# Patient Record
Sex: Female | Born: 1960 | Race: White | Hispanic: No | Marital: Single
Health system: Midwestern US, Community
[De-identification: ages and names within clinical notes are randomized; demographics above are authoritative.]

## PROBLEM LIST (undated history)

## (undated) DIAGNOSIS — E785 Hyperlipidemia, unspecified: Secondary | ICD-10-CM

## (undated) DIAGNOSIS — F419 Anxiety disorder, unspecified: Secondary | ICD-10-CM

## (undated) DIAGNOSIS — N809 Endometriosis, unspecified: Secondary | ICD-10-CM

## (undated) DIAGNOSIS — J449 Chronic obstructive pulmonary disease, unspecified: Secondary | ICD-10-CM

## (undated) DIAGNOSIS — I10 Essential (primary) hypertension: Secondary | ICD-10-CM

## (undated) DIAGNOSIS — E559 Vitamin D deficiency, unspecified: Secondary | ICD-10-CM

## (undated) DIAGNOSIS — M7580 Other shoulder lesions, unspecified shoulder: Secondary | ICD-10-CM

## (undated) DIAGNOSIS — M5136 Other intervertebral disc degeneration, lumbar region: Secondary | ICD-10-CM

## (undated) DIAGNOSIS — M8580 Other specified disorders of bone density and structure, unspecified site: Secondary | ICD-10-CM

## (undated) DIAGNOSIS — M51369 Other intervertebral disc degeneration, lumbar region without mention of lumbar back pain or lower extremity pain: Secondary | ICD-10-CM

## (undated) DIAGNOSIS — M545 Low back pain, unspecified: Secondary | ICD-10-CM

## (undated) DIAGNOSIS — M419 Scoliosis, unspecified: Secondary | ICD-10-CM

## (undated) DIAGNOSIS — Z803 Family history of malignant neoplasm of breast: Principal | ICD-10-CM

## (undated) DIAGNOSIS — Z1231 Encounter for screening mammogram for malignant neoplasm of breast: Secondary | ICD-10-CM

## (undated) DIAGNOSIS — M5412 Radiculopathy, cervical region: Secondary | ICD-10-CM

## (undated) HISTORY — PX: BREAST SURGERY: SHX581

## (undated) HISTORY — PX: ABDOMINAL HYSTERECTOMY: SHX81

## (undated) HISTORY — DX: Other specified disorders of bone density and structure, unspecified site: M85.80

## (undated) HISTORY — PX: LEEP: SHX91

## (undated) HISTORY — DX: Endometriosis, unspecified: N80.9

## (undated) HISTORY — DX: Vitamin D deficiency, unspecified: E55.9

## (undated) HISTORY — DX: Chronic obstructive pulmonary disease, unspecified: J44.9

## (undated) HISTORY — DX: Anxiety disorder, unspecified: F41.9

## (undated) HISTORY — PX: OTHER SURGICAL HISTORY: SHX169

---

## 1998-01-16 ENCOUNTER — Inpatient Hospital Stay (HOSPITAL_COMMUNITY): Admission: RE | Admit: 1998-01-16 | Discharge: 1998-01-17 | Payer: Self-pay | Admitting: *Deleted

## 1998-11-24 ENCOUNTER — Other Ambulatory Visit: Admission: RE | Admit: 1998-11-24 | Discharge: 1998-11-24 | Payer: Self-pay | Admitting: *Deleted

## 1999-10-25 ENCOUNTER — Encounter: Admission: RE | Admit: 1999-10-25 | Discharge: 1999-10-25 | Payer: Self-pay | Admitting: Neurology

## 1999-10-25 ENCOUNTER — Encounter: Payer: Self-pay | Admitting: Neurology

## 2000-05-09 ENCOUNTER — Other Ambulatory Visit: Admission: RE | Admit: 2000-05-09 | Discharge: 2000-05-09 | Payer: Self-pay | Admitting: *Deleted

## 2001-05-18 ENCOUNTER — Other Ambulatory Visit: Admission: RE | Admit: 2001-05-18 | Discharge: 2001-05-18 | Payer: Self-pay | Admitting: Obstetrics and Gynecology

## 2002-05-17 ENCOUNTER — Other Ambulatory Visit: Admission: RE | Admit: 2002-05-17 | Discharge: 2002-05-17 | Payer: Self-pay | Admitting: Obstetrics and Gynecology

## 2002-05-21 ENCOUNTER — Encounter: Admission: RE | Admit: 2002-05-21 | Discharge: 2002-05-21 | Payer: Self-pay | Admitting: *Deleted

## 2002-05-21 ENCOUNTER — Encounter: Payer: Self-pay | Admitting: *Deleted

## 2002-07-02 ENCOUNTER — Ambulatory Visit (HOSPITAL_BASED_OUTPATIENT_CLINIC_OR_DEPARTMENT_OTHER): Admission: RE | Admit: 2002-07-02 | Discharge: 2002-07-02 | Payer: Self-pay | Admitting: *Deleted

## 2003-06-14 ENCOUNTER — Other Ambulatory Visit: Admission: RE | Admit: 2003-06-14 | Discharge: 2003-06-14 | Payer: Self-pay | Admitting: *Deleted

## 2004-02-29 ENCOUNTER — Ambulatory Visit: Payer: Self-pay | Admitting: Family Medicine

## 2004-06-20 ENCOUNTER — Ambulatory Visit: Payer: Self-pay | Admitting: Family Medicine

## 2004-06-25 ENCOUNTER — Ambulatory Visit: Payer: Self-pay | Admitting: Family Medicine

## 2004-09-17 ENCOUNTER — Other Ambulatory Visit: Admission: RE | Admit: 2004-09-17 | Discharge: 2004-09-17 | Payer: Self-pay | Admitting: Obstetrics and Gynecology

## 2005-08-29 ENCOUNTER — Inpatient Hospital Stay (HOSPITAL_COMMUNITY): Admission: EM | Admit: 2005-08-29 | Discharge: 2005-09-01 | Payer: Self-pay | Admitting: Emergency Medicine

## 2005-08-30 ENCOUNTER — Ambulatory Visit: Payer: Self-pay | Admitting: Cardiology

## 2005-08-30 ENCOUNTER — Encounter: Payer: Self-pay | Admitting: Cardiology

## 2007-04-16 HISTORY — PX: BREAST EXCISIONAL BIOPSY: SUR124

## 2007-04-16 HISTORY — PX: BREAST BIOPSY: SHX20

## 2007-04-16 HISTORY — PX: OTHER SURGICAL HISTORY: SHX169

## 2007-07-02 ENCOUNTER — Encounter: Admission: RE | Admit: 2007-07-02 | Discharge: 2007-07-02 | Payer: Self-pay | Admitting: Sports Medicine

## 2007-07-09 ENCOUNTER — Ambulatory Visit (HOSPITAL_BASED_OUTPATIENT_CLINIC_OR_DEPARTMENT_OTHER): Admission: RE | Admit: 2007-07-09 | Discharge: 2007-07-09 | Payer: Self-pay | Admitting: Orthopedic Surgery

## 2007-11-27 ENCOUNTER — Encounter: Admission: RE | Admit: 2007-11-27 | Discharge: 2007-11-27 | Payer: Self-pay | Admitting: Obstetrics and Gynecology

## 2007-12-03 ENCOUNTER — Encounter: Admission: RE | Admit: 2007-12-03 | Discharge: 2007-12-03 | Payer: Self-pay | Admitting: Obstetrics and Gynecology

## 2008-01-05 ENCOUNTER — Encounter: Admission: RE | Admit: 2008-01-05 | Discharge: 2008-01-05 | Payer: Self-pay | Admitting: General Surgery

## 2008-01-08 ENCOUNTER — Other Ambulatory Visit: Admission: RE | Admit: 2008-01-08 | Discharge: 2008-01-08 | Payer: Self-pay | Admitting: General Surgery

## 2008-01-08 ENCOUNTER — Encounter: Admission: RE | Admit: 2008-01-08 | Discharge: 2008-01-08 | Payer: Self-pay | Admitting: General Surgery

## 2009-02-03 ENCOUNTER — Observation Stay (HOSPITAL_COMMUNITY): Admission: EM | Admit: 2009-02-03 | Discharge: 2009-02-03 | Payer: Self-pay | Admitting: Emergency Medicine

## 2009-09-29 ENCOUNTER — Encounter: Payer: Self-pay | Admitting: Gastroenterology

## 2009-10-02 ENCOUNTER — Encounter (INDEPENDENT_AMBULATORY_CARE_PROVIDER_SITE_OTHER): Payer: Self-pay | Admitting: *Deleted

## 2009-10-13 ENCOUNTER — Ambulatory Visit: Payer: Self-pay | Admitting: Gastroenterology

## 2009-10-13 ENCOUNTER — Ambulatory Visit: Payer: Self-pay | Admitting: Cardiology

## 2009-10-13 DIAGNOSIS — K5732 Diverticulitis of large intestine without perforation or abscess without bleeding: Secondary | ICD-10-CM

## 2009-10-13 DIAGNOSIS — M479 Spondylosis, unspecified: Secondary | ICD-10-CM | POA: Insufficient documentation

## 2009-10-13 DIAGNOSIS — Z90710 Acquired absence of both cervix and uterus: Secondary | ICD-10-CM

## 2009-10-13 DIAGNOSIS — R1084 Generalized abdominal pain: Secondary | ICD-10-CM | POA: Insufficient documentation

## 2009-10-17 ENCOUNTER — Encounter (INDEPENDENT_AMBULATORY_CARE_PROVIDER_SITE_OTHER): Payer: Self-pay | Admitting: *Deleted

## 2009-10-17 ENCOUNTER — Telehealth: Payer: Self-pay | Admitting: Gastroenterology

## 2009-10-19 ENCOUNTER — Ambulatory Visit: Payer: Self-pay | Admitting: Gastroenterology

## 2009-10-23 ENCOUNTER — Ambulatory Visit: Payer: Self-pay | Admitting: Gastroenterology

## 2009-10-26 ENCOUNTER — Encounter: Payer: Self-pay | Admitting: Gastroenterology

## 2009-10-26 DIAGNOSIS — E538 Deficiency of other specified B group vitamins: Secondary | ICD-10-CM

## 2009-10-26 LAB — CONVERTED CEMR LAB
AST: 17 units/L (ref 0–37)
Alkaline Phosphatase: 47 units/L (ref 39–117)
Basophils Absolute: 0 10*3/uL (ref 0.0–0.1)
CRP, High Sensitivity: 0.47 (ref 0.00–5.00)
GFR calc non Af Amer: 105.76 mL/min (ref >60)
HCT: 42.1 % (ref 36.0–46.0)
Iron: 101 ug/dL (ref 42–145)
Lymphs Abs: 3.1 10*3/uL (ref 0.7–4.0)
MCV: 99.1 fL (ref 78.0–100.0)
Monocytes Absolute: 0.5 10*3/uL (ref 0.1–1.0)
Platelets: 230 10*3/uL (ref 150.0–400.0)
Potassium: 4.9 meq/L (ref 3.5–5.1)
RDW: 13.8 % (ref 11.5–14.6)
Sed Rate: 5 mm/hr (ref 0–22)
Sodium: 141 meq/L (ref 135–145)
TSH: 1.27 microintl units/mL (ref 0.35–5.50)
Total Bilirubin: 0.4 mg/dL (ref 0.3–1.2)

## 2009-11-01 ENCOUNTER — Encounter: Admission: RE | Admit: 2009-11-01 | Discharge: 2009-11-01 | Payer: Self-pay | Admitting: Internal Medicine

## 2009-11-01 ENCOUNTER — Encounter: Payer: Self-pay | Admitting: Gastroenterology

## 2010-05-15 NOTE — Letter (Signed)
Summary: [facility]   Imported By: Lester Glenwood 10/19/2009 11:22:07  _____________________________________________________________________  External Attachment:    Type:   Image     Comment:   External Document

## 2010-05-15 NOTE — Progress Notes (Signed)
Summary: Change in Pain meds  Phone Note Call from Patient Call back at [phone]   Caller: Patient Summary of Call: pt states that she tried the tramadol and it made her vomit and have alot of nausea. she is wanting to know if she can have something eles in place, she even ate with the meds.  Initial call taken by: Harlow Mares CMA Duncan Dull),  October 17, 2009 8:41 AM  Follow-up for Phone Call        needs colonoscopy ASAP. She can use oxycontin 10 mg q6-8 h .#50.Marland Kitchenno refill for now... Follow-up by: Mardella Layman MD Clementeen Graham,  October 17, 2009 11:28 AM  Additional Follow-up for Phone Call Additional follow up Details #1::        LM for pt to call/   Ashok Cordia RN  October 17, 2009 2:54 PM  Talked with pt.  States she can not take Oyxcodone.  She has trougle taking strong medications.  Pt states she took Lorcet years ago when she broke her ankle and tolerated it OK.  Pt asks if she can try this until she has colon next week.  Will pick up Rx tomorrow when she comes for previsit. Additional Follow-up by: Ashok Cordia RN,  October 18, 2009 8:49 AM    Additional Follow-up for Phone Call Additional follow up Details #2::    ok Follow-up by: Mardella Layman MD [facility],  October 18, 2009 9:32 AM  Additional Follow-up for Phone Call Additional follow up Details #3:: Details for Additional Follow-up Action Taken: Rx printed.  for Dr. Jarold Motto to sign.  Pt will pick up tomorrow. Additional Follow-up by: Ashok Cordia RN,  October 18, 2009 9:41 AM  New/Updated Medications: LORCET PLUS 7.5-650 MG TABS (HYDROCODONE-ACETAMINOPHEN) 1 q 6 hrs as needed for pain Prescriptions: LORCET PLUS 7.5-650 MG TABS (HYDROCODONE-ACETAMINOPHEN) 1 q 6 hrs as needed for pain  #30 x 0   Entered by:   Ashok Cordia RN   Authorized by:   Mardella Layman MD [facility]   Signed by:   Ashok Cordia RN on 10/18/2009   Method used:   Print then Give to Patient   RxID:   [id number]

## 2010-05-15 NOTE — Letter (Signed)
Summary: New Patient letter  [facility]  [address] [place], [place] [address]   Phone: [phone]  Fax: [phone]       10/02/2009 MRN: [id number]  Monique Arroyo [address] [place], [place]  [address]  Dear Ms. Bratcher,  Welcome to the Gastroenterology Division at [facility].    You are scheduled to see Dr. Jarold Motto on 10/13/2009 at 10:00AM on the 3rd floor at [facility], [address]  We ask that you try to arrive at our office 15 minutes prior to your appointment time to allow for check-in.  We would like you to complete the enclosed self-administered evaluation form prior to your visit and bring it with you on the day of your appointment.  We will review it with you.  Also, please bring a complete list of all your medications or, if you prefer, bring the medication bottles and we will list them.  Please bring your insurance card so that we may make a copy of it.  If your insurance requires a referral to see a specialist, please bring your referral form from your primary care physician.  Co-payments are due at the time of your visit and may be paid by cash, check or credit card.     Your office visit will consist of a consult with your physician (includes a physical exam), any laboratory testing he/she may order, scheduling of any necessary diagnostic testing (e.g. x-ray, ultrasound, CT-scan), and scheduling of a procedure (e.g. Endoscopy, Colonoscopy) if required.  Please allow enough time on your schedule to allow for any/all of these possibilities.    If you cannot keep your appointment, please call [phone] to cancel or reschedule prior to your appointment date.  This allows [place] the opportunity to schedule an appointment for another patient in need of care.  If you do not cancel or reschedule by 5 p.m. the business day prior to your appointment date, you will be charged a $50.00 late cancellation/no-show fee.    Thank you for choosing [facility] for your medical needs.  We appreciate the opportunity to care for you.  Please visit [place] at our website  to learn more about our practice.                     Sincerely,                                                             The Gastroenterology Division

## 2010-05-15 NOTE — Letter (Signed)
Summary: [facility] Instructions  [facility]  [address] [place], [place] [address]   Phone: [phone]  Fax: [phone]       Monique Arroyo    23-May-1960    MRN: [id number]        Procedure Day [name]:  Monique Arroyo  10/23/09     Arrival Time:  2:00pm     Procedure Time:  3:00pm     Location of Procedure:                    Monique Arroyo  [facility] (4th Floor)                        PREPARATION FOR COLONOSCOPY WITH MOVIPREP   Starting 5 days prior to your procedure  [facility] 07/06  do not eat nuts, seeds, popcorn, corn, beans, peas,  salads, or any raw vegetables.  Do not take any fiber supplements (e.g. Metamucil, Citrucel, and Benefiber).  THE DAY BEFORE YOUR PROCEDURE         DATE:  SUNDAY  07/10  1.  Drink clear liquids the entire day-NO SOLID FOOD  2.  Do not drink anything colored red or purple.  Avoid juices with pulp.  No orange juice.  3.  Drink at least 64 oz. (8 glasses) of fluid/clear liquids during the day to prevent dehydration and help the prep work efficiently.  CLEAR LIQUIDS INCLUDE: Water Jello Ice Popsicles Tea (sugar ok, no milk/cream) Powdered fruit flavored drinks Coffee (sugar ok, no milk/cream) Gatorade Juice: apple, white grape, white cranberry  Lemonade Clear bullion, consomm, broth Carbonated beverages (any kind) Strained chicken noodle soup Hard Candy                             4.  In the morning, mix first dose of MoviPrep solution:    Empty 1 Pouch A and 1 Pouch B into the disposable container    Add lukewarm drinking water to the top line of the container. Mix to dissolve    Refrigerate (mixed solution should be used within 24 hrs)  5.  Begin drinking the prep at 5:00 p.m. The MoviPrep container is divided by 4 marks.   Every 15 minutes drink the solution down to the next mark (approximately 8 oz) until the full liter is complete.   6.  Follow completed prep with 16 oz of clear liquid of your choice  (Nothing red or purple).  Continue to drink clear liquids until bedtime.  7.  Before going to bed, mix second dose of MoviPrep solution:    Empty 1 Pouch A and 1 Pouch B into the disposable container    Add lukewarm drinking water to the top line of the container. Mix to dissolve    Refrigerate  THE DAY OF YOUR PROCEDURE      DATE:  MONDAY  07/11  Beginning at  10:00 a.m. (5 hours before procedure):         1. Every 15 minutes, drink the solution down to the next mark (approx 8 oz) until the full liter is complete.  2. Follow completed prep with 16 oz. of clear liquid of your choice.    3. You may drink clear liquids until 1:00pm  (2 HOURS BEFORE PROCEDURE).   MEDICATION INSTRUCTIONS  Unless otherwise instructed, you should take regular prescription medications with a small sip of water  as early as possible the morning of your procedure.        OTHER INSTRUCTIONS  You will need a responsible adult at least 52 years of age to accompany you and drive you home.   This person must remain in the waiting room during your procedure.  Wear loose fitting clothing that is easily removed.  Leave jewelry and other valuables at home.  However, you may wish to bring a book to read or  an iPod/MP3 player to listen to music as you wait for your procedure to start.  Remove all body piercing jewelry and leave at home.  Total time from sign-in until discharge is approximately 2-3 hours.  You should go home directly after your procedure and rest.  You can resume normal activities the  day after your procedure.  The day of your procedure you should not:   Drive   Make legal decisions   Operate machinery   Drink alcohol   Return to work  You will receive specific instructions about eating, activities and medications before you leave.    The above instructions have been reviewed and explained to me by   Monique Sites RN  October 19, 2009 2:02 PM     I fully understand and  can verbalize these instructions _____________________________ Date _________

## 2010-05-15 NOTE — Letter (Signed)
Summary: Patient Notice- Colon Biospy Results  [facility]  [address] [place], [place] [address]   Phone: [phone]  Fax: [phone]        October 26, 2009 MRN: [id number]    Monique Arroyo [address] [place], [place]  [address]    Dear Ms. Derego,  I am pleased to inform you that the biopsies taken during your recent colonoscopy did not show any evidence of cancer upon pathologic examination.  Additional information/recommendations:  __No further action is needed at this time.  Please follow-up with      your primary care physician for your other healthcare needs.  __Please call [phone] to schedule a return visit to review      your condition.  x__Continue with the treatment plan as outlined on the day of your      exam.I recommend followup with Dr. Marcelle Overlie in gynecology for consideration of laparoscopy exam.  __You should have a repeat colonoscopy examination for this problem           in _ years.  Please call us if you are having persistent problems or have questions about your condition that have not been fully answered at this time.  Sincerely,  Mardella Layman MD [facility]   This letter has been electronically signed by your physician.  Appended Document: Patient Notice- Colon Biospy Results letter mailed

## 2010-05-15 NOTE — Miscellaneous (Signed)
Summary: [facility]  Clinical Lists Changes  Medications: Added new medication of MOVIPREP 100 GM  SOLR (PEG-KCL-NACL-NASULF-NA ASC-C) As per prep instructions. - Signed Rx of MOVIPREP 100 GM  SOLR (PEG-KCL-NACL-NASULF-NA ASC-C) As per prep instructions.;  #1 x 0;  Signed;  Entered by: [name] RN;  Authorized by: Mardella Layman MD [facility];  Method used: [facility] [phone]*, [address] [place], [place], [place]  [address], Ph: [phone], Fax: [phone] Allergies: Changed allergy or adverse reaction from [facility] to [facility]    Prescriptions: MOVIPREP 100 GM  SOLR (PEG-KCL-NACL-NASULF-NA ASC-C) As per prep instructions.  #1 x 0   Entered by:   [name] RN   Authorized by:   Mardella Layman MD [facility]   Signed by:   [name] RN on 10/19/2009   Method used:   [facility] to        [facility] [phone]* (retail)       [address]       [place], [place]  [address]       Ph: [phone]       Fax: [phone]   RxID:   [id number]
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
## 2010-05-15 NOTE — Procedures (Signed)
Summary: Colonoscopy  Patient: Monique Arroyo Note: All result statuses are Final unless otherwise noted.  Tests: (1) Colonoscopy (COL)   COL Colonoscopy           DONE     [facility]     [address]     [place], [place]  [address]           COLONOSCOPY PROCEDURE REPORT           PATIENT:  Monique Arroyo, Monique Arroyo  MR#:  [id number]     BIRTHDATE:  10-Jan-1961, 49 yrs. old  GENDER:  female     ENDOSCOPIST:  Vania Rea. Jarold Motto, MD, [facility]     REF. BY:  Richarda Overlie, M.D.     PROCEDURE DATE:  10/23/2009     PROCEDURE:  Colonoscopy with biopsy     ASA CLASS:  Class II     INDICATIONS:  Abdominal pain     MEDICATIONS:   Fentanyl 100 mcg IV, Versed 10 mg IV, Benadryl 25     mg IV           DESCRIPTION OF PROCEDURE:   After the risks benefits and     alternatives of the procedure were thoroughly explained, informed     consent was obtained.  Digital rectal exam was performed and     revealed no abnormalities.   The [id number] [id number] endoscope was     introduced through the anus and advanced to the terminal ileum     which was intubated for a short distance, limited by poor     preparation.  PATIENT HARD TO SEDATE.  The quality of the prep was     poor, using MoviPrep.  The instrument was then slowly withdrawn as     the colon was fully examined.     <<PROCEDUREIMAGES>>           FINDINGS:  The terminal ileum appeared normal. BX. DONE  No polyps     or cancers were seen.  no active bleeding or blood in c.  A nodule     was found. PERIRECTAL HYPERPLASTIC NODULES BIOPSIED.SEE PICTURES.     Retroflexed views in the rectum revealed no other findings other     than those already described.    The scope was then withdrawn from     the patient and the procedure completed.           COMPLICATIONS:  None     ENDOSCOPIC IMPRESSION:     1) Normal terminal ileum     2) No polyps or cancers     3) No active bleeding or blood in c     4) Nodule     5) No other findings other than those already  described.     NO CAUSE FOR SEVERE PAIN REQUIRING NARCOTIC.CT SCAN X 2 NEGATIVE     ALSO.     RECOMMENDATIONS:     GYN LAPROSCOPY PROBABLY INDICATED.??? ADHESIONS.     REPEAT EXAM:  No           ______________________________     Vania Rea. Jarold Motto, MD, [facility]           CC:           n.     eSIGNED:   Vania Rea. Patterson at 10/23/2009 03:45 PM           Brien Few, [id number]  Note: An exclamation mark (!) indicates a result that was not dispersed  into the flowsheet. Document Creation Date: 10/23/2009 3:45 PM _______________________________________________________________________  (1) Order result status: Final Collection or observation date-time: 10/23/2009 15:36 Requested date-time:  Receipt date-time:  Reported date-time:  Referring Physician:   Ordering Physician: Sheryn Bison [phone]) Specimen Source:  Source: Launa Grill Order Number: [id number] Lab site:   Appended Document: Colonoscopy WILL START B12 SHOTS,CHECK L-S SPINE FILMS, AND HAVE PLACED CALL TO DR. HOLLAND IN GYN PER POSSIBLE LAPROSCOPY...DRP  Appended Document: Colonoscopy Lm for pt to call.  Appended Document: Colonoscopy Pt notified.  Pt has appt with PCP tomorrow and would like to get B12 inj done there.  Labs faxed to [facility] DO.  Pt will also get Xray of spind done thru Dr. Elisabeth Most.  Appended Document: Colonoscopy     Procedures Next Due Date:    Colonoscopy: 10/2019

## 2010-05-15 NOTE — Letter (Signed)
Summary: Patient [facility] Biopsy Results  [facility]  [address] [place], [place] [address]   Phone: [phone]  Fax: [phone]        October 26, 2009 MRN: [id number]    Monique Arroyo [address] [place], [place]  [address]    Dear [name],  I am pleased to inform you that the biopsies taken during your recent endoscopic examination did not show any evidence of cancer upon pathologic examination.  Additional information/recommendations:  __No further action is needed at this time.  Please follow-up with      your primary care physician for your other healthcare needs.  __ Please call [phone] to schedule a return visit to review      your condition.  _x_ Continue with the treatment plan as outlined on the day of your      exam.  __ You should have a repeat endoscopic examination for this problem              in _ months/years.   Please call us if you are having persistent problems or have questions about your condition that have not been fully answered at this time.  Sincerely,  Mardella Layman MD [facility]  This letter has been electronically signed by your physician.  Appended Document: Patient Notice-Endo Biopsy Results letter mailed

## 2010-07-19 LAB — URINALYSIS, ROUTINE W REFLEX MICROSCOPIC
Bilirubin Urine: NEGATIVE
Ketones, ur: NEGATIVE mg/dL
Nitrite: NEGATIVE
Urobilinogen, UA: 1 mg/dL (ref 0.0–1.0)

## 2010-07-19 LAB — COMPREHENSIVE METABOLIC PANEL
ALT: 85 U/L — ABNORMAL HIGH (ref 0–35)
AST: 143 U/L — ABNORMAL HIGH (ref 0–37)
Albumin: 4.4 g/dL (ref 3.5–5.2)
CO2: 23 mEq/L (ref 19–32)
Calcium: 9.1 mg/dL (ref 8.4–10.5)
GFR calc Af Amer: >60 mL/min (ref >60)
GFR calc non Af Amer: >60 mL/min (ref >60)
Sodium: 143 mEq/L (ref 135–145)
Total Protein: 7.1 g/dL (ref 6.0–8.3)

## 2010-07-19 LAB — APTT: aPTT: 23 seconds — ABNORMAL LOW (ref 24–37)

## 2010-07-19 LAB — POCT I-STAT, CHEM 8
Calcium, Ion: 1.08 mmol/L — ABNORMAL LOW (ref 1.12–1.32)
Glucose, Bld: 85 mg/dL (ref 70–99)
HCT: 50 % — ABNORMAL HIGH (ref 36.0–46.0)
Hemoglobin: 17 g/dL — ABNORMAL HIGH (ref 12.0–15.0)
TCO2: 22 mmol/L (ref 0–100)

## 2010-07-19 LAB — CBC
MCHC: 34.2 g/dL (ref 30.0–36.0)
Platelets: 232 10*3/uL (ref 150–400)
RBC: 4.51 MIL/uL (ref 3.87–5.11)
WBC: 9.7 10*3/uL (ref 4.0–10.5)

## 2010-07-19 LAB — TYPE AND SCREEN
ABO/RH(D): A POS
Antibody Screen: NEGATIVE

## 2010-07-19 LAB — POCT PREGNANCY, URINE: Preg Test, Ur: NEGATIVE

## 2010-07-19 LAB — ABO/RH: ABO/RH(D): A POS

## 2010-08-28 NOTE — Op Note (Signed)
NAMESHOLANDA, CROSON NO.:  [id number]   MEDICAL RECORD NO.:  [id number]          PATIENT TYPE:  AMB   LOCATION:  DSC                          FACILITY:  [facility]   PHYSICIAN:  Eulas Post, MD    DATE OF BIRTH:  May 14, 1960   DATE OF PROCEDURE:  07/09/2007  DATE OF DISCHARGE:                               OPERATIVE REPORT   PREOPERATIVE DIAGNOSIS:  Right bimalleolar ankle fracture.   POSTOPERATIVE DIAGNOSIS:  Right bimalleolar ankle fracture.   OPERATIVE PROCEDURE:  Open reduction and internal fixation of the right  bimalleolar ankle fracture.   ANESTHESIA:  General.   TOURNIQUET TIME:  35 minutes.   ANTIBIOTICS:  1 g intravenous Ancef preoperatively.   OPERATIVE IMPLANTS:  A Synthes one-third tubular plate, size six hole,  with a total of three cancellous screws distally and two cortical screws  proximally, with one interfragmentary lag screw, cortical.   PREOPERATIVE INDICATIONS:  Ms. Sania Noy is a 50 year old woman who had  a bimalleolar ankle fracture with some mortise widening.  She had an x-  ray as well as a CT scan that confirmed these findings.  She elected to  undergo the above-named procedure.  The risks, benefits and alternatives  to operative intervention were discussed with her preoperatively,  including but not limited to the risks of infection, bleeding, nerve  injury, malunion, nonunion, hardware failure, the need for hardware  removal, cardiopulmonary complications, among others, and she was  willing to proceed.  She is a smoker, which puts her at higher risk for  some of these complications.   OPERATIVE FINDINGS:  The bone quality was actually quite good.  We were  able to achieve an anatomic reduction.  Excellent fixation was achieved  distally and the lag screw had an excellent hold as well.   OPERATIVE PROCEDURE:  The patient brought to the operating room and  placed in a supine position.  General anesthesia was administered.   One  gram intravenous Ancef was given.  The right lower extremity was prepped  and draped in the usual sterile fashion.  Tourniquet was elevated and  inflated.  A standard lateral approach was performed.  The fibula was  reduced with clamps and held in position while a lag screw was placed.  A six-hole plate was then placed.  Excellent fixation was achieved.  Confirmation of reduction was made on AP and lateral views.  A stress x-  ray did not demonstrate any evidence for syndesmosis injury.  We then  irrigated the wounds copiously and  closed the deep tissue with 3-0 Vicryl, followed by 4-0 Monocryl for the  skin.  Steri-Strips and gauze were placed and a posterior splint.  The  patient was awakened and returned PACU in stable and satisfactory  condition.  There were no complications.  The patient tolerated the  procedure well.      Eulas Post, MD  Electronically Signed     JPL/MEDQ  D:  07/09/2007  T:  07/10/2007  Job:  [id number]

## 2010-08-31 NOTE — H&P (Signed)
NAMEMarland Kitchen  Arroyo, Monique Arroyo   MEDICAL RECORD NO.:  [id number]          PATIENT TYPE:  EMS   LOCATION:  ED                           FACILITY:  [facility]   PHYSICIAN:  Hettie Holstein, D.O.    DATE OF BIRTH:  [birth year]   DATE OF ADMISSION:  08/29/2005  DATE OF DISCHARGE:                                HISTORY & PHYSICAL   PRIMARY CARE PHYSICIAN:  Unassigned.  Her primary care physician has since  moved away.  She [facility] to Urgent [facility].   CHIEF COMPLAINT:  Left-sided numbness and tingling, vague.   HISTORY OF PRESENT ILLNESS:  This is a 50 year old very pleasant Caucasian  female with a history of hypertension and prior history of abnormal MRI in  the past, approximately 20 years ago, addressed by Dr. Sandria Manly of urology.  The  absolute nature of this is not clearly certain at this time.  In any event,  Monique Arroyo has had two days of not feeling well.  Her blood pressure was  elevated yesterday per self assessment as well as last night and early this  morning.  She had some left arm tingling and numbness that extends from her  left upper extremity to her neck and face.  At the time of this  symptomatology, she said she checked her blood pressure.  She stated it was  normal at that time.  Her family denies any slurred speech or facial droop  and no problems with ataxia.  She is left-handed in the emergency  department.  She did have symmetrical strength.  No facial droop, although  she did exhibit some subtle dysmetria, otherwise no other focal neurologic  symptoms.  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  She is being admitted for further evaluation, management, and risk factors  assessment, and neurology consultation.   PAST MEDICAL HISTORY:  As noted above.  She has been on blood pressure  medications for the past year, atenolol.  She does have some anxiety.  She  is status post hysterectomy seven years ago.  She  retains her ovaries.  During that procedure, she is told that she was unable to undergo  appendectomy.   MEDICATIONS:  1.  Atenolol 100 mg p.o. q.24h.  She gets her refills at [facility] in [place].  2.  Xanax 0.5 mg on a p.r.n. basis.   ALLERGIES:  No known drug allergies.   SOCIAL HISTORY:  She is married.  She has two teenage children.  One is  about to attend [employer].  She smokes about one pack per  day for the past 20 years.  She works at [employer].  She states that this  is a physically active job position. She does not have a routine exercise  regimen.   FAMILY HISTORY:  Her mother is alive and well at age 89 with only  hypertension.  Her father died while she was 5 in a plane crash.   REVIEW OF SYSTEMS:  Only reports increased stress lately.  No nausea,  vomiting, diarrhea, fevers, chills, night sweats, chest pain, or shortness  of breath.  Further review of systems is unremarkable.   PHYSICAL EXAMINATION:  VITAL SIGNS:  She was evaluated in the emergency  department.  Vital signs were initially stable at 138/85.  Temperature 97.9.  Heart rate 59.  Respirations 20.  O2 saturation 97%.  GENERAL:  Patient is awake and alert.  Pleasant.  No acute distress.  NECK:  Supple and nontender.  No palpable thyromegaly.  No mass.  LUNGS:  Clear bilaterally.  There is normal effort.  No dullness to  percussion.  CARDIOVASCULAR:  Normal S1 and S2.  No appreciable murmur.  ABDOMEN:  Soft and nontender.  EXTREMITIES:  No edema.  Peripheral pulses are symmetrical and palpable  bilaterally.  NEUROLOGIC:  She has symmetrical strength in the upper and lower  extremities.  She is left-handed.  She exhibits some mild dysmetria with  finger to nose in reference to her left upper extremity.  She exhibits no  facial droop.  Extraocular muscles are intact.  Cranial nerves II-XII are  grossly intact.  She is euthymic.  Her affect is stable.   LABORATORY  DATA:  CT, as noted above.  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   Sodium 134, potassium 3.8, BUN 12, creatinine 0.8, glucose 125.  WBC 9.4,  hemoglobin 15.3, platelet count 266, MCV 96.  Urine pregnancy negative.  Urinalysis negative.  Point-of-care markers negative as well.   ASSESSMENT:  1.  Transient ischemic attack versus cerebrovascular accident.  2.  Hypertension.  3.  Tobacco dependence.  4.  Anxiety.   PLAN:  At this time, we are going to admit Monique Arroyo to a telemetry floor.  Follow her clinical course.  Pursue TIA and stroke evaluation per protocol.  Check fasting lipid profile, homocysteine, hemoglobin A1C, MRI, carotid  Dopplers.  In addition, we will order a B12 and folate.      Hettie Holstein, D.O.  Electronically Signed     ESS/MEDQ  D:  08/29/2005  T:  08/29/2005  Job:  [id number]

## 2010-08-31 NOTE — Consult Note (Signed)
NAMEMarland Kitchen  DANN, VENTRESS NO.:  [id number]   MEDICAL RECORD NO.:  [id number]          PATIENT TYPE:  INP   LOCATION:  0105                         FAC[facility]   PHYSICIAN:  Marlan Palau, M.D.  DATE OF BIRTH:  02/21/1961   DATE OF CONSULTATION:  08/29/2005  DATE OF DISCHARGE:                                   CONSULTATION   HISTORY OF PRESENT ILLNESS:  Monique Arroyo is a 50 year old, left-handed  white female, born 10/12/1960, with a history of hypertension.  This  patient has been under a bit of stress recently.  It was felt that her blood  pressure was a bit elevated last evening.  The patient comes to the [facility] Emergency Room after awakening with left face, left arm numbness.  The  patient denies any headache, visual field changes, speech changes, weakness  one side or the other, gait disturbance, dizziness. The patient has improved  a bit as the day has gone on, but has normalized.  The patient is being seen  by neurology for further evaluation.  CT scan of the head shows an old left  centrum semiovale infarct.  The patient recalls this finding years ago  during pregnancy. The patient was not on aspirin or any other antiplatelet  agents prior to this admission.   PAST MEDICAL HISTORY:  Significant for:  1.  New onset of left face and arm numbness.  2.  Hypertension.  3.  Hysterectomy.  4.  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   MEDICATIONS:  Medications on admission include:  1.  Atenolol 100 mg daily.  2.  Xanax 0.5 mg if needed.   HABITS:  The patient smokes a pack of cigarettes a day.  Drinks alcohol on  occasion.   SOCIAL HISTORY:  The patient lives in the [place] area.  Is married.  Does work for [employer].  Has two children who are alive and  well.   FAMILY HISTORY:  The mother is alive with hypertension.  Father died in a  plane crash.  The patient has one brother and one sister who are alive and  well.  No family history of stroke, MI, cancer, or diabetes.   REVIEW OF SYSTEMS:  Notable for no recent fevers, chills.  The patient  denies headache, vision changes, falling problems, neck pain.  Denies  shortness of breath, chest pain abdominal pain, nausea, vomiting.  Denies  any problems with gait disturbance, dizziness, black-out episodes.   PHYSICAL EXAMINATION:  VITAL SIGNS:  Blood pressure initially 188/113, has  since come down to 138/85.  Heart rate 59, respiratory rate 20, afebrile.  GENERAL:  The patient is a minimally obese, white female who is alert,  cooperative at the time of examination.  HEENT:  Head is atraumatic.  Pupils are equal, round and reactive to light.  Disks are flat bilaterally.  NECK:  Supple.  No carotid bruits noted.  RESPIRATORY:  Clear.  CARDIOVASCULAR:  Regular rate and rhythm.  No obvious murmurs or rubs  noted.  EXTREMITIES:  Without significant edema.  NEUROLOGIC:  Cranial nerves as above.  Facial symmetry is present.  The  patient has good sensation to the face to pinprick and soft touch  bilaterally.  Has good strength of the facial muscles, muscles of head  turning and shoulder shrug bilaterally.  Speech is well enunciated, not  aphasic.  Motor testing reveals 5/5 strength in all fours.  Good symmetric  motor tone is noted throughout.  Sensory testing is intact to pinprick and  soft touch.  Vibratory sensation throughout.  The patient has good finger-  nose-finger and heel-to-shin.  Gait normal.  Tandem gait normal.  Romberg  negative.  Questionable minimal left upper extremity drift is noted. The  patient has good asymmetry and reflex.  Toes are downgoing bilaterally.   LABORATORY DATA:  Urinalysis showed specific gravity 1.016, pH 6.  Otherwise  unremarkable.  White count 9.4, hemoglobin 15.3, hematocrit 45, MCV 95.9,  platelets 266.  Sodium 134, potassium 3.8, chloride 101, CO2 28, glucose  125, BUN 12, creatinine 0.8, calcium 9.5.  Troponin-I less than 0.05.  MB  fraction 1.  The EKG reveals sinus bradycardia with heart rate of 52, ST-T  wave abnormalities. Consider anterolateral ischemia.  This is a stable  recording.   IMPRESSION:  1.  Episode of left face, left arm numbness, rule out small vessel ischemic      event.  2.  Hypertension.   This patient is currently objectively roughly normal.  I question whether  there may be a bit of left upper extremity drift.  The patient would be  admitted briefly for quick evaluation.   PLAN:  1.  Aspirin therapy.  2.  MRI of the brain.  3.  MRI angiogram.  4.  2-D echocardiogram.   Will follow the patient's clinical course while in-house.  If MRI study is  unremarkable, the patient may be discharged to home.      Marlan Palau, M.D.  Electronically Signed     CKW/MEDQ  D:  08/29/2005  T:  08/30/2005  Job:  [id number]

## 2010-08-31 NOTE — Discharge Summary (Signed)
Monique Arroyo, Monique Arroyo NO.:  [id number]   MEDICAL RECORD NO.:  [id number]          PATIENT TYPE:  INP   LOCATION:  1432                         [facility]ACILITY:  [facility]   PHYSICIAN:  Michaelyn Barter, M.D. DATE OF BIRTH:  [birth year]   DATE OF ADMISSION:  08/29/2005  DATE OF DISCHARGE:  09/01/2005                                 DISCHARGE SUMMARY   PRIMARY CARE PHYSICIAN:  Unassigned.   FINAL DIAGNOSES:  1.  Left face and left arm numbness.  2.  Hypertension.  3.  Anxiety.   CONSULTATIONS:  Neurology with Marlan Palau, M.D.   PROCEDURES:  1.  CT scan of the head without contrast completed Aug 29, 2005.  2.  X-ray of the chest completed Aug 29, 2005.  3.  MRI/MRA of the brain completed Aug 30, 2005.  4.  2-D echocardiogram completed Aug 30, 2005.  5.  MRA of the neck without contrast completed Aug 30, 2005.   HISTORY OF PRESENT ILLNESS:  Monique Arroyo is a 50 year old female who stated  that 2 days prior to this hospitalization she did not feel well.  The day  prior to this hospitalization she felt as though her blood pressure was  elevated.  She developed some left arm tingling and numbness, which extended  from her left upper extremity to her neck and face.  She checked her blood  pressure and found that to be normal.  She had no slurred speech or facial  droop.  She came to the hospital for further evaluation.   For past medical history, please see that dictated by Hettie Holstein, D.O.,  on Aug 29, 2005.   HOSPITAL COURSE:  Problem 1.  LEFT-SIDED NUMBNESS AND TINGLING:  The patient had a CT scan  completed without contrast on May 17.  The final impression was that the  patient had mild atrophy, probable old left parietal CVA, no definite acute  infarct or bleed.  A chest x-ray was completed also, which revealed no  active disease.  Neurology was consulted.  Dr. Anne Hahn was the physician who  saw the patient.  His impression was that the patient had an episode of  left  face and arm numbness, rule out small vessel ischemic event.  He recommended  an MRI and MRA be completed.  The patient had both of these completed.  The  MRI of the brain without and without contrast was completed on May 18.  The  final impression was that the patient had premature atrophy.  Remote left  periventricular stroke.  Asymmetric brain substance loss, left anterior  temporal lobe. CSF-like fluid collection, left posterior fossa, with mild  mass effect on the left cerebellar hemisphere.  Question arachnoid cyst.  No  evidence for acute stroke.  MR angiography was also completed and it  revealed unremarkable MR angiography of the intracranial circulation without  contrast.  Over the course of the patient's hospitalization she stated that  she felt much better.  The tingling decreased in the hand over the course of  hospitalization.  By May 20 she  indicated that she felt ready to be  discharged from the hospital.   Problem 2.  HYPERTENSION:  This remained stable throughout the course of the  patient's hospitalization.  The patient also had a 2-D echocardiogram  completed during this hospitalization.  The final impression of that test  was:  Overall left ventricular systolic function normal.  There were no left  ventricular regional wall motion abnormalities.  The aortic valve was mildly  calcified.  The left atrial size was at upper limits of normal.   At the request of the patient she was discharged from the hospital.  Her  condition was stable.  She was discharged home on:  1.  Aspirin 81 mg p.o. daily.  2.  Atenolol 50 mg p.o. daily.  3.  Norvasc 25 mg p.o. daily.  4.  Chantix 0.5 mg p.o. daily.  5.  Xanax 0.5 mg b.i.d.  6.  Lipitor 10 mg p.o. daily.      Michaelyn Barter, M.D.  Electronically Signed     OR/MEDQ  D:  10/06/2005  T:  10/07/2005  Job:  [id number]

## 2010-09-19 ENCOUNTER — Other Ambulatory Visit: Payer: Self-pay | Admitting: Obstetrics and Gynecology

## 2010-09-19 DIAGNOSIS — Z1231 Encounter for screening mammogram for malignant neoplasm of breast: Secondary | ICD-10-CM

## 2010-10-03 ENCOUNTER — Ambulatory Visit
Admission: RE | Admit: 2010-10-03 | Discharge: 2010-10-03 | Disposition: A | Discharge status: Home / Self Care | Payer: BC Managed Care – PPO | Source: Ambulatory Visit | Attending: Obstetrics and Gynecology | Admitting: Obstetrics and Gynecology

## 2010-10-03 DIAGNOSIS — Z1231 Encounter for screening mammogram for malignant neoplasm of breast: Secondary | ICD-10-CM

## 2011-01-07 LAB — BASIC METABOLIC PANEL
CO2: 27
Calcium: 9.1
GFR calc Af Amer: >60
GFR calc non Af Amer: >60
Glucose, Bld: 89
Potassium: 3.9
Sodium: 135

## 2011-04-16 LAB — HM MAMMOGRAPHY: HM Mammogram: NORMAL

## 2011-04-16 LAB — HM PAP SMEAR: HM Pap smear: NORMAL

## 2011-08-26 ENCOUNTER — Other Ambulatory Visit: Payer: Self-pay | Admitting: Obstetrics and Gynecology

## 2011-08-26 DIAGNOSIS — Z1231 Encounter for screening mammogram for malignant neoplasm of breast: Secondary | ICD-10-CM

## 2011-10-07 ENCOUNTER — Ambulatory Visit
Admission: RE | Admit: 2011-10-07 | Discharge: 2011-10-07 | Disposition: A | Discharge status: Home / Self Care | Payer: BC Managed Care – PPO | Source: Ambulatory Visit | Attending: Obstetrics and Gynecology | Admitting: Obstetrics and Gynecology

## 2011-10-07 DIAGNOSIS — Z1231 Encounter for screening mammogram for malignant neoplasm of breast: Secondary | ICD-10-CM

## 2012-03-30 ENCOUNTER — Emergency Department (HOSPITAL_COMMUNITY): Payer: BC Managed Care – PPO

## 2012-03-30 ENCOUNTER — Emergency Department (HOSPITAL_COMMUNITY)
Admission: EM | Admit: 2012-03-30 | Discharge: 2012-03-30 | Disposition: A | Discharge status: Home / Self Care | Payer: BC Managed Care – PPO | Attending: Emergency Medicine | Admitting: Emergency Medicine

## 2012-03-30 ENCOUNTER — Encounter (HOSPITAL_COMMUNITY): Payer: Self-pay | Admitting: Emergency Medicine

## 2012-03-30 DIAGNOSIS — F17200 Nicotine dependence, unspecified, uncomplicated: Secondary | ICD-10-CM | POA: Insufficient documentation

## 2012-03-30 DIAGNOSIS — M545 Low back pain, unspecified: Secondary | ICD-10-CM | POA: Insufficient documentation

## 2012-03-30 DIAGNOSIS — Y929 Unspecified place or not applicable: Secondary | ICD-10-CM | POA: Insufficient documentation

## 2012-03-30 DIAGNOSIS — Y939 Activity, unspecified: Secondary | ICD-10-CM | POA: Insufficient documentation

## 2012-03-30 DIAGNOSIS — S161XXA Strain of muscle, fascia and tendon at neck level, initial encounter: Secondary | ICD-10-CM

## 2012-03-30 DIAGNOSIS — X58XXXA Exposure to other specified factors, initial encounter: Secondary | ICD-10-CM | POA: Insufficient documentation

## 2012-03-30 DIAGNOSIS — M51379 Other intervertebral disc degeneration, lumbosacral region without mention of lumbar back pain or lower extremity pain: Secondary | ICD-10-CM | POA: Insufficient documentation

## 2012-03-30 DIAGNOSIS — I10 Essential (primary) hypertension: Secondary | ICD-10-CM | POA: Insufficient documentation

## 2012-03-30 DIAGNOSIS — G8929 Other chronic pain: Secondary | ICD-10-CM | POA: Insufficient documentation

## 2012-03-30 DIAGNOSIS — S139XXA Sprain of joints and ligaments of unspecified parts of neck, initial encounter: Secondary | ICD-10-CM | POA: Insufficient documentation

## 2012-03-30 DIAGNOSIS — Z79899 Other long term (current) drug therapy: Secondary | ICD-10-CM | POA: Insufficient documentation

## 2012-03-30 DIAGNOSIS — M5137 Other intervertebral disc degeneration, lumbosacral region: Secondary | ICD-10-CM | POA: Insufficient documentation

## 2012-03-30 DIAGNOSIS — E785 Hyperlipidemia, unspecified: Secondary | ICD-10-CM | POA: Insufficient documentation

## 2012-03-30 HISTORY — DX: Essential (primary) hypertension: I10

## 2012-03-30 HISTORY — DX: Hyperlipidemia, unspecified: E78.5

## 2012-03-30 HISTORY — DX: Other intervertebral disc degeneration, lumbar region: M51.36

## 2012-03-30 HISTORY — DX: Other shoulder lesions, unspecified shoulder: M75.80

## 2012-03-30 HISTORY — DX: Other intervertebral disc degeneration, lumbar region without mention of lumbar back pain or lower extremity pain: M51.369

## 2012-03-30 MED ORDER — METOCLOPRAMIDE HCL 5 MG/ML IJ SOLN
10.0000 mg | Freq: Once | INTRAMUSCULAR | Status: AC
  Administered 2012-03-30: 10 mg via INTRAVENOUS
  Filled 2012-03-30: qty 2

## 2012-03-30 MED ORDER — HYDROMORPHONE HCL PF 1 MG/ML IJ SOLN
1.0000 mg | Freq: Once | INTRAMUSCULAR | Status: AC
  Administered 2012-03-30: 1 mg via INTRAVENOUS
  Filled 2012-03-30: qty 1

## 2012-03-30 MED ORDER — KETOROLAC TROMETHAMINE 30 MG/ML IJ SOLN
30.0000 mg | Freq: Once | INTRAMUSCULAR | Status: AC
  Administered 2012-03-30: 30 mg via INTRAVENOUS
  Filled 2012-03-30: qty 1

## 2012-03-30 MED ORDER — MORPHINE SULFATE 4 MG/ML IJ SOLN
4.0000 mg | Freq: Once | INTRAMUSCULAR | Status: AC
  Administered 2012-03-30: 4 mg via INTRAVENOUS
  Filled 2012-03-30: qty 1

## 2012-03-30 MED ORDER — SODIUM CHLORIDE 0.9 % IV SOLN
1000.0000 mL | INTRAVENOUS | Status: DC
  Administered 2012-03-30: 1000 mL via INTRAVENOUS

## 2012-03-30 MED ORDER — SODIUM CHLORIDE 0.9 % IV SOLN
1000.0000 mL | Freq: Once | INTRAVENOUS | Status: AC
  Administered 2012-03-30: 1000 mL via INTRAVENOUS

## 2012-03-30 MED ORDER — IBUPROFEN 600 MG PO TABS: 600.0000 mg | ORAL_TABLET | Freq: Three times a day (TID) | ORAL | Status: DC | PRN

## 2012-03-30 NOTE — ED Notes (Signed)
Pt presenting to ed with c/o headache pain x 1 week that radiates into her right hip and leg. Pt states she has chronic back pain and she thinks it may be related to her back. Pt states she called her doctor at [facility] and they told her to present to the nearest ER. Pt states the tramadol isn't helping. Pt denies sensitivity to light and noise. Pt denies nausea and vomiting at this time. Pt denies blurred vision.

## 2012-03-30 NOTE — ED Provider Notes (Signed)
History     CSN: [id number]  Arrival date & time 03/30/12  1149   First MD Initiated Contact with Patient 03/30/12 1206      Chief Complaint  Patient presents with  . Headache     The history is provided by the patient.   the patient reports right-sided neck pain with radiation down into her right back and right scapular region.  No chest pain or shortness of breath.  The pain radiates all the way down to her right low back.  She has a long-standing history of chronic low back pain.  She is followed by a pain physician for her degenerative lumbar disc which is usually managed with anti-inflammatory medicines and tramadol.  She reports the pain is worsened in her neck over the past week.  No weakness of her upper lower extremities.  No recent trauma.  The pain started gradually.  She does not remember any acute injury to her neck.  She reports the tramadol currently is helping her pain.  She called her pain physician today who recommended that she be evaluated in the emergency department.  Her pain is worsened by movement of her neck.  Nothing improves her pain.  Past Medical History  Diagnosis Date  . Degenerative lumbar disc   . Hyperlipidemia   . Hypertension   . AC (acromioclavicular) joint bone spurs     Past Surgical History  Procedure Date  . Abdominal hysterectomy     No family history on file.  History  Substance Use Topics  . Smoking status: Current Every Day Smoker -- 1.0 packs/day    Types: Cigarettes  . Smokeless tobacco: Not on file  . Alcohol Use: No    OB History    Grav Para Term Preterm Abortions TAB SAB Ect Mult Living                  Review of Systems  Neurological: Positive for headaches.  All other systems reviewed and are negative.    Allergies  Erythromycin  Home Medications   Current Outpatient Rx  Name  Route  Sig  Dispense  Refill  . ALPRAZOLAM 1 MG PO TABS   Oral   Take 1 mg by mouth at bedtime as needed. For anxiety/sleep          . AMLODIPINE BESYLATE 5 MG PO TABS   Oral   Take 5 mg by mouth daily.         Marland Kitchen VITAMIN D 1000 UNITS PO TABS   Oral   Take 1,000 Units by mouth daily.         Marland Kitchen GABAPENTIN 400 MG PO CAPS   Oral   Take 400-1,200 mg by mouth 3 (three) times daily. Take 1 tablet twice daily and 3 at bedtime         . ALLERGY EYE DROPS OP   Ophthalmic   Apply 1 drop to eye every morning. For allergies         . METHOCARBAMOL 750 MG PO TABS   Oral   Take 750 mg by mouth 3 (three) times daily as needed. For pain         . PRAVASTATIN SODIUM 20 MG PO TABS   Oral   Take 20 mg by mouth daily.         . TRAMADOL HCL 50 MG PO TABS   Oral   Take 50-100 mg by mouth 4 (four) times daily as needed. For pain         .  ATENOLOL 50 MG PO TABS   Oral   Take 50 mg by mouth daily.         . IBUPROFEN 600 MG PO TABS   Oral   Take 1 tablet (600 mg total) by mouth every 8 (eight) hours as needed for pain.   15 tablet   0     BP 129/74  Pulse 62  Temp 97.6 F (36.4 C) (Oral)  Resp 20  SpO2 99%  Physical Exam  Nursing note and vitals reviewed. Constitutional: She is oriented to person, place, and time. She appears well-developed and well-nourished. No distress.  HENT:  Head: Normocephalic and atraumatic.  Eyes: EOM are normal. Pupils are equal, round, and reactive to light.  Neck: Normal range of motion. No tracheal deviation present.       Mild cervical and paracervical tenderness.  Paracervical tenderness is on the right and radiates down into her right trapezius muscle.  No focal spasm noted.  Cardiovascular: Normal rate, regular rhythm and normal heart sounds.   Pulmonary/Chest: Effort normal and breath sounds normal. No stridor.  Abdominal: Soft. She exhibits no distension. There is no tenderness.  Musculoskeletal: Normal range of motion.  Lymphadenopathy:    She has no cervical adenopathy.  Neurological: She is alert and oriented to person, place, and time.       5/5  strength in major muscle groups of  bilateral upper and lower extremities. Speech normal. No facial asymetry.   Skin: Skin is warm and dry.  Psychiatric: She has a normal mood and affect. Judgment normal.    ED Course  Procedures (including critical care time)  Labs Reviewed - No data to display Dg Cervical Spine Complete  03/30/2012  *RADIOLOGY REPORT*  Clinical Data: Neck pain.  CERVICAL SPINE - COMPLETE 4+ VIEW  Comparison: 02/03/2009.  Findings: The lateral film demonstrates normal alignment of the cervical vertebral bodies.  Disc spaces and vertebral bodies are maintained.  No acute bony findings or abnormal prevertebral soft tissue swelling.  The oblique films demonstrate normally aligned articular facets and patent neural foramen.  The C1-C2 articulations are maintained. The lung apices are clear.  Small cervical ribs are noted.  IMPRESSION: Normal alignment and no acute bony findings.   Original Report Authenticated By: Rudie Meyer, M.D.    I personally reviewed the imaging tests through PACS system I reviewed available ER/hospitalization records through the EMR   1. Cervical strain   2. Chronic pain       MDM  4:05 PM The patient's pain is significantly improved.  Given her pain contract with her pain physician I recommended she followup with her pain doctor for ongoing pain needs.  Until then she'll take tramadol and ibuprofen at home for pain.  She understands return to the ER for new or worsening symptoms        Lyanne Co, MD 03/30/12 [phone]

## 2012-04-13 ENCOUNTER — Ambulatory Visit: Payer: Worker's Compensation

## 2012-08-31 ENCOUNTER — Other Ambulatory Visit: Payer: Self-pay

## 2012-08-31 DIAGNOSIS — Z1231 Encounter for screening mammogram for malignant neoplasm of breast: Secondary | ICD-10-CM

## 2012-09-10 ENCOUNTER — Ambulatory Visit (HOSPITAL_COMMUNITY)
Admission: RE | Admit: 2012-09-10 | Discharge: 2012-09-10 | Disposition: A | Discharge status: Home / Self Care | Payer: BC Managed Care – PPO | Source: Ambulatory Visit | Attending: Internal Medicine | Admitting: Internal Medicine

## 2012-09-10 ENCOUNTER — Other Ambulatory Visit (HOSPITAL_COMMUNITY): Payer: Self-pay | Admitting: Internal Medicine

## 2012-09-10 DIAGNOSIS — Z0000 Encounter for general adult medical examination without abnormal findings: Secondary | ICD-10-CM | POA: Insufficient documentation

## 2012-09-10 DIAGNOSIS — R05 Cough: Secondary | ICD-10-CM

## 2012-09-10 DIAGNOSIS — R059 Cough, unspecified: Secondary | ICD-10-CM | POA: Insufficient documentation

## 2012-09-10 DIAGNOSIS — F17200 Nicotine dependence, unspecified, uncomplicated: Secondary | ICD-10-CM | POA: Insufficient documentation

## 2012-10-07 ENCOUNTER — Ambulatory Visit
Admission: RE | Admit: 2012-10-07 | Discharge: 2012-10-07 | Disposition: A | Discharge status: Home / Self Care | Payer: BC Managed Care – PPO | Source: Ambulatory Visit

## 2012-10-07 ENCOUNTER — Ambulatory Visit: Payer: Self-pay

## 2012-10-07 DIAGNOSIS — Z1231 Encounter for screening mammogram for malignant neoplasm of breast: Secondary | ICD-10-CM

## 2012-10-16 ENCOUNTER — Emergency Department (HOSPITAL_COMMUNITY)
Admission: EM | Admit: 2012-10-16 | Discharge: 2012-10-16 | Disposition: A | Discharge status: Home / Self Care | Payer: BC Managed Care – PPO | Attending: Emergency Medicine | Admitting: Emergency Medicine

## 2012-10-16 ENCOUNTER — Encounter (HOSPITAL_COMMUNITY): Payer: Self-pay | Admitting: *Deleted

## 2012-10-16 DIAGNOSIS — F17200 Nicotine dependence, unspecified, uncomplicated: Secondary | ICD-10-CM | POA: Insufficient documentation

## 2012-10-16 DIAGNOSIS — M549 Dorsalgia, unspecified: Secondary | ICD-10-CM

## 2012-10-16 DIAGNOSIS — E785 Hyperlipidemia, unspecified: Secondary | ICD-10-CM | POA: Insufficient documentation

## 2012-10-16 DIAGNOSIS — Z79899 Other long term (current) drug therapy: Secondary | ICD-10-CM | POA: Insufficient documentation

## 2012-10-16 DIAGNOSIS — Z8739 Personal history of other diseases of the musculoskeletal system and connective tissue: Secondary | ICD-10-CM | POA: Insufficient documentation

## 2012-10-16 DIAGNOSIS — I10 Essential (primary) hypertension: Secondary | ICD-10-CM | POA: Insufficient documentation

## 2012-10-16 DIAGNOSIS — G8929 Other chronic pain: Secondary | ICD-10-CM | POA: Insufficient documentation

## 2012-10-16 MED ORDER — METHYLPREDNISOLONE SODIUM SUCC 125 MG IJ SOLR
125.0000 mg | Freq: Once | INTRAMUSCULAR | Status: AC
  Administered 2012-10-16: 125 mg via INTRAMUSCULAR
  Filled 2012-10-16: qty 2

## 2012-10-16 MED ORDER — PROMETHAZINE HCL 25 MG PO TABS
25.0000 mg | ORAL_TABLET | Freq: Once | ORAL | Status: AC
  Administered 2012-10-16: 25 mg via ORAL
  Filled 2012-10-16: qty 1

## 2012-10-16 MED ORDER — HYDROMORPHONE HCL PF 2 MG/ML IJ SOLN
2.0000 mg | Freq: Once | INTRAMUSCULAR | Status: AC
  Administered 2012-10-16: 2 mg via INTRAMUSCULAR
  Filled 2012-10-16: qty 1

## 2012-10-16 NOTE — ED Provider Notes (Signed)
History    CSN: [id number] Arrival date & time 10/16/12  1447  First MD Initiated Contact with Patient 10/16/12 1518     Chief Complaint  Patient presents with  . Back Pain   (Consider location/radiation/quality/duration/timing/severity/associated sxs/prior Treatment) HPI  Monique Arroyo is a 52 y.o.female with a significant PMH of degenerative disc disease that failed Pain management clinic for her chronic back pain presents to the ER with complaints of exacerbation of her chronic pain. She is currently a patient and Dietitian and has Percocet and Norco along with Gabapentin and other medications at home. She says the pain has gotten away from her and she needs help. She does not want a prescription for pain meds but is afraid the pain has gotten away from her. She denies the quality of pain being different. No bowel or urine incontinence, no symptoms.   Past Medical History  Diagnosis Date  . Degenerative lumbar disc   . Hyperlipidemia   . Hypertension   . AC (acromioclavicular) joint bone spurs    Past Surgical History  Procedure Laterality Date  . Abdominal hysterectomy     History reviewed. No pertinent family history. History  Substance Use Topics  . Smoking status: Current Every Day Smoker -- 1.00 packs/day    Types: Cigarettes  . Smokeless tobacco: Not on file  . Alcohol Use: No   OB History   Grav Para Term Preterm Abortions TAB SAB Ect Mult Living                 Review of Systems  Musculoskeletal: Positive for back pain.  All other systems reviewed and are negative.    Allergies  Erythromycin  Home Medications   Current Outpatient Rx  Name  Route  Sig  Dispense  Refill  . amLODipine (NORVASC) 5 MG tablet   Oral   Take 5 mg by mouth at bedtime.          Marland Kitchen atenolol (TENORMIN) 50 MG tablet   Oral   Take 50 mg by mouth every evening.          . cholecalciferol (VITAMIN D) 1000 UNITS tablet   Oral   Take 1,000 Units by mouth every  evening.          . gabapentin (NEURONTIN) 400 MG capsule   Oral   Take 1,200 mg by mouth 3 (three) times daily.          Marland Kitchen HYDROcodone-acetaminophen (NORCO/VICODIN) 5-325 MG per tablet   Oral   Take 1 tablet by mouth every 6 (six) hours as needed for pain.         Marland Kitchen oxyCODONE-acetaminophen (PERCOCET) 10-325 MG per tablet   Oral   Take 1 tablet by mouth every 8 (eight) hours as needed for pain.         . pravastatin (PRAVACHOL) 40 MG tablet   Oral   Take 40 mg by mouth every evening.          BP 123/79  Pulse 70  Temp(Src) 98.5 F (36.9 C) (Oral)  Resp 20  SpO2 98% Physical Exam  Nursing note and vitals reviewed. Constitutional: She appears well-developed and well-nourished. No distress.  HENT:  Head: Normocephalic and atraumatic.  Eyes: Pupils are equal, round, and reactive to light.  Neck: Normal range of motion. Neck supple.  Cardiovascular: Normal rate and regular rhythm.   Pulmonary/Chest: Effort normal.  Abdominal: Soft.  Musculoskeletal:       Back:  Equal strength to bilateral lower extremities. Neurosensory function adequate to both legs. Skin color is normal. Skin is warm and moist. I see no step off deformity, no bony tenderness. Pt is able to ambulate without limp. Pain is relieved when sitting in certain positions. ROM is decreased due to pain. No crepitus, laceration, effusion, swelling.  Pulses are normal    Neurological: She is alert.  Skin: Skin is warm and dry.    ED Course  Procedures (including critical care time) Labs Reviewed - No data to display No results found. 1. Chronic back pain     MDM  Patient with back pain. No neurological deficits. Patient is ambulatory. No warning symptoms of back pain including: loss of bowel or bladder control, night sweats, waking from sleep with back pain, unexplained fevers or weight loss, h/o cancer, IVDU, recent significant  trauma. No concern for cauda equina, epidural abscess, or other serious  cause of back pain. Conservative measures such as rest, ice/heate indicated with PCP follow-up if no improvement with conservative management.   No prescription given to patient. Was given 2 mg IM dilaudid and 125mg  IM Solumedrol.  52 y.o.Vic Blackbird Rexrode's evaluation in the Emergency Department is complete. It has been determined that no acute conditions requiring further emergency intervention are present at this time. The patient/guardian have been advised of the diagnosis and plan. We have discussed signs and symptoms that warrant return to the ED, such as changes or worsening in symptoms.  Vital signs are stable at discharge. Filed Vitals:   10/16/12 1457  BP: 123/79  Pulse: 70  Temp: 98.5 F (36.9 C)  Resp: 20    Patient/guardian has voiced understanding and agreed to follow-up with the PCP or specialist.    Dorthula Matas, PA-C 10/16/12 1600

## 2012-10-16 NOTE — ED Notes (Signed)
Pt has hx of degenerative disc disease and states the pain is "unbearable" today. Pt tearful in triage.

## 2012-10-16 NOTE — ED Provider Notes (Signed)
Medical screening examination/treatment/procedure(s) were performed by non-physician practitioner and as supervising physician I was immediately available for consultation/collaboration.  Ethelda Chick, MD 10/16/12 [phone]

## 2013-04-11 ENCOUNTER — Encounter: Payer: Self-pay | Admitting: Internal Medicine

## 2013-04-11 DIAGNOSIS — M5136 Other intervertebral disc degeneration, lumbar region: Secondary | ICD-10-CM | POA: Insufficient documentation

## 2013-04-11 DIAGNOSIS — E785 Hyperlipidemia, unspecified: Secondary | ICD-10-CM | POA: Insufficient documentation

## 2013-04-11 DIAGNOSIS — F419 Anxiety disorder, unspecified: Secondary | ICD-10-CM | POA: Insufficient documentation

## 2013-04-11 DIAGNOSIS — M8580 Other specified disorders of bone density and structure, unspecified site: Secondary | ICD-10-CM | POA: Insufficient documentation

## 2013-04-11 DIAGNOSIS — I10 Essential (primary) hypertension: Secondary | ICD-10-CM | POA: Insufficient documentation

## 2013-04-11 DIAGNOSIS — E559 Vitamin D deficiency, unspecified: Secondary | ICD-10-CM | POA: Insufficient documentation

## 2013-04-13 ENCOUNTER — Encounter: Payer: Self-pay | Admitting: Emergency Medicine

## 2013-04-13 ENCOUNTER — Ambulatory Visit (INDEPENDENT_AMBULATORY_CARE_PROVIDER_SITE_OTHER): Payer: BC Managed Care – PPO | Admitting: Emergency Medicine

## 2013-04-13 VITALS — BP 118/64 | HR 68 | Temp 98.6°F | Resp 18 | Ht 63.5 in | Wt 125.0 lb

## 2013-04-13 DIAGNOSIS — E559 Vitamin D deficiency, unspecified: Secondary | ICD-10-CM

## 2013-04-13 DIAGNOSIS — I10 Essential (primary) hypertension: Secondary | ICD-10-CM

## 2013-04-13 DIAGNOSIS — E782 Mixed hyperlipidemia: Secondary | ICD-10-CM

## 2013-04-13 DIAGNOSIS — Z79899 Other long term (current) drug therapy: Secondary | ICD-10-CM

## 2013-04-13 LAB — BASIC METABOLIC PANEL WITH GFR
CO2: 31 mEq/L (ref 19–32)
Chloride: 104 mEq/L (ref 96–112)
GFR, Est African American: >89 mL/min
Glucose, Bld: 87 mg/dL (ref 70–99)
Potassium: 4.3 mEq/L (ref 3.5–5.3)
Sodium: 140 mEq/L (ref 135–145)

## 2013-04-13 LAB — HEPATIC FUNCTION PANEL
ALT: 14 U/L (ref 0–35)
AST: 13 U/L (ref 0–37)
Albumin: 4.6 g/dL (ref 3.5–5.2)
Alkaline Phosphatase: 47 U/L (ref 39–117)
Total Protein: 6.3 g/dL (ref 6.0–8.3)

## 2013-04-13 LAB — CBC WITH DIFFERENTIAL/PLATELET
Eosinophils Absolute: 0.3 10*3/uL (ref 0.0–0.7)
HCT: 43.3 % (ref 36.0–46.0)
Hemoglobin: 14.6 g/dL (ref 12.0–15.0)
Lymphs Abs: 3.8 10*3/uL (ref 0.7–4.0)
MCH: 30.7 pg (ref 26.0–34.0)
MCV: 91 fL (ref 78.0–100.0)
Monocytes Relative: 6 % (ref 3–12)
Neutrophils Relative %: 54 % (ref 43–77)
RBC: 4.76 MIL/uL (ref 3.87–5.11)

## 2013-04-13 LAB — MAGNESIUM: Magnesium: 2 mg/dL (ref 1.5–2.5)

## 2013-04-13 NOTE — Progress Notes (Signed)
Subjective:    Patient ID: Monique Arroyo, female    DOB: 06-Nov-1960, 52 y.o.   MRN: [id number]  HPI Comments: 53 yo female presents for 3 month F/U for HTN, Cholesterol, Pre-Dm, D. Deficient. BP has been good at home. She is eating healthier, trying to control cholesterol with diet and herbals. Cholesterol RX all have made her hurt more and weak in the legs. She is trying to get disability for chronic back pain, she had recently received injections. LAST LABS T 231 L 165 H 42 She was unable to tolerate last cholesterol RX. She has added magnesium, cinnamon and cholesterol herb/ vit to try to help. Not working but has increased walking for exercise.   She notes she cannot hav back surgery per ortho but has injections which help very little. She is trying to et on disability for her chronic back pain.  Hypertension  Hyperlipidemia    Current Outpatient Prescriptions on File Prior to Visit  Medication Sig Dispense Refill  . ALPRAZolam (XANAX) 0.5 MG tablet Take 0.5 mg by mouth at bedtime as needed for anxiety.      Marland Kitchen amLODipine (NORVASC) 5 MG tablet Take 5 mg by mouth at bedtime.       Marland Kitchen atenolol (TENORMIN) 50 MG tablet Take 50 mg by mouth every evening.       . cholecalciferol (VITAMIN D) 1000 UNITS tablet Take 1,000 Units by mouth every evening.       . fluticasone (FLONASE) 50 MCG/ACT nasal spray Place into both nostrils as needed for allergies or rhinitis.      Marland Kitchen gabapentin (NEURONTIN) 400 MG capsule Take 1,200 mg by mouth 3 (three) times daily.       Marland Kitchen oxyCODONE-acetaminophen (PERCOCET) 10-325 MG per tablet Take 1 tablet by mouth every 8 (eight) hours as needed for pain.       No current facility-administered medications on file prior to visit.   ALLERGIES Citalopram; Crestor; Erythromycin; Lipitor; Prednisone; and Zoloft  Past Medical History  Diagnosis Date  . AC (acromioclavicular) joint bone spurs   . Hypertension   . Hyperlipidemia   . Degenerative lumbar disc   . Anxiety    . Osteopenia   . COPD (chronic obstructive pulmonary disease)   . Endometriosis   . Vitamin D deficiency      Review of Systems  Musculoskeletal: Positive for back pain.  All other systems reviewed and are negative.   BP 118/64  Pulse 68  Temp(Src) 98.6 F (37 C) (Temporal)  Resp 18  Ht 5' 3.5" (1.613 m)  Wt 125 lb (56.7 kg)  BMI 21.79 kg/m2     Objective:   Physical Exam  Nursing note and vitals reviewed. Constitutional: She is oriented to person, place, and time. She appears well-developed and well-nourished. No distress.  HENT:  Head: Normocephalic and atraumatic.  Right Ear: External ear normal.  Left Ear: External ear normal.  Nose: Nose normal.  Mouth/Throat: Oropharynx is clear and moist.  Eyes: Conjunctivae and EOM are normal.  Neck: Normal range of motion. Neck supple. No JVD present. No thyromegaly present.  Cardiovascular: Normal rate, regular rhythm, normal heart sounds and intact distal pulses.   Pulmonary/Chest: Effort normal and breath sounds normal.  Abdominal: Soft. Bowel sounds are normal. She exhibits no distension and no mass. There is no tenderness. There is no rebound and no guarding.  Musculoskeletal: Normal range of motion. She exhibits no edema and no tenderness.  Slow to change positions.  Lymphadenopathy:  She has no cervical adenopathy.  Neurological: She is alert and oriented to person, place, and time. No cranial nerve deficit.  Skin: Skin is warm and dry. No rash noted. No erythema. No pallor.  Psychiatric: She has a normal mood and affect. Her behavior is normal. Judgment and thought content normal.          Assessment & Plan:  1.  3 month F/U for HTN, Cholesterol, D. Deficient. Needs healthy diet, cardio QD and obtain healthy weight. Check Labs, Check BP if >130/80 call office 2. CHRONIC BACK PAIN- KEEP ORTHO f/u AD

## 2013-04-13 NOTE — Patient Instructions (Signed)
Fat and Cholesterol Control Diet  Fat and cholesterol levels in your blood and organs are influenced by your diet. High levels of fat and cholesterol may lead to diseases of the heart, small and large blood vessels, gallbladder, liver, and pancreas.  CONTROLLING FAT AND CHOLESTEROL WITH DIET  Although exercise and lifestyle factors are important, your diet is key. That is because certain foods are known to raise cholesterol and others to lower it. The goal is to balance foods for their effect on cholesterol and more importantly, to replace saturated and trans fat with other types of fat, such as monounsaturated fat, polyunsaturated fat, and omega-3 fatty acids.  On average, a person should consume no more than 15 to 17 g of saturated fat daily. Saturated and trans fats are considered "bad" fats, and they will raise LDL cholesterol. Saturated fats are primarily found in animal products such as meats, butter, and cream. However, that does not mean you need to give up all your favorite foods. Today, there are good tasting, low-fat, low-cholesterol substitutes for most of the things you like to eat. Choose low-fat or nonfat alternatives. Choose round or loin cuts of red meat. These types of cuts are lowest in fat and cholesterol. Chicken (without the skin), fish, veal, and ground turkey breast are great choices. Eliminate fatty meats, such as hot dogs and salami. Even shellfish have little or no saturated fat. Have a 3 oz (85 g) portion when you eat lean meat, poultry, or fish.  Trans fats are also called "partially hydrogenated oils." They are oils that have been scientifically manipulated so that they are solid at room temperature resulting in a longer shelf life and improved taste and texture of foods in which they are added. Trans fats are found in stick margarine, some tub margarines, cookies, crackers, and baked goods.   When baking and cooking, oils are a great substitute for butter. The monounsaturated oils are  especially beneficial since it is believed they lower LDL and raise HDL. The oils you should avoid entirely are saturated tropical oils, such as coconut and palm.   Remember to eat a lot from food groups that are naturally free of saturated and trans fat, including fish, fruit, vegetables, beans, grains (barley, rice, couscous, bulgur wheat), and pasta (without cream sauces).   IDENTIFYING FOODS THAT LOWER FAT AND CHOLESTEROL   Soluble fiber may lower your cholesterol. This type of fiber is found in fruits such as apples, vegetables such as broccoli, potatoes, and carrots, legumes such as beans, peas, and lentils, and grains such as barley. Foods fortified with plant sterols (phytosterol) may also lower cholesterol. You should eat at least 2 g per day of these foods for a cholesterol lowering effect.   Read package labels to identify low-saturated fats, trans fat free, and low-fat foods at the supermarket. Select cheeses that have only 2 to 3 g saturated fat per ounce. Use a heart-healthy tub margarine that is free of trans fats or partially hydrogenated oil. When buying baked goods (cookies, crackers), avoid partially hydrogenated oils. Breads and muffins should be made from whole grains (whole-wheat or whole oat flour, instead of "flour" or "enriched flour"). Buy non-creamy canned soups with reduced salt and no added fats.   FOOD PREPARATION TECHNIQUES   Never deep-fry. If you must fry, either stir-fry, which uses very little fat, or use non-stick cooking sprays. When possible, broil, bake, or roast meats, and steam vegetables. Instead of putting butter or margarine on vegetables, use lemon   and herbs, applesauce, and cinnamon (for squash and sweet potatoes). Use nonfat yogurt, salsa, and low-fat dressings for salads.   LOW-SATURATED FAT / LOW-FAT FOOD SUBSTITUTES  Meats / Saturated Fat (g)  · Avoid: Steak, marbled (3 oz/85 g) / 11 g  · Choose: Steak, lean (3 oz/85 g) / 4 g  · Avoid: Hamburger (3 oz/85 g) / 7  g  · Choose: Hamburger, lean (3 oz/85 g) / 5 g  · Avoid: Ham (3 oz/85 g) / 6 g  · Choose: Ham, lean cut (3 oz/85 g) / 2.4 g  · Avoid: Chicken, with skin, dark meat (3 oz/85 g) / 4 g  · Choose: Chicken, skin removed, dark meat (3 oz/85 g) / 2 g  · Avoid: Chicken, with skin, light meat (3 oz/85 g) / 2.5 g  · Choose: Chicken, skin removed, light meat (3 oz/85 g) / 1 g  Dairy / Saturated Fat (g)  · Avoid: Whole milk (1 cup) / 5 g  · Choose: Low-fat milk, 2% (1 cup) / 3 g  · Choose: Low-fat milk, 1% (1 cup) / 1.5 g  · Choose: Skim milk (1 cup) / 0.3 g  · Avoid: Hard cheese (1 oz/28 g) / 6 g  · Choose: Skim milk cheese (1 oz/28 g) / 2 to 3 g  · Avoid: Cottage cheese, 4% fat (1 cup) / 6.5 g  · Choose: Low-fat cottage cheese, 1% fat (1 cup) / 1.5 g  · Avoid: Ice cream (1 cup) / 9 g  · Choose: Sherbet (1 cup) / 2.5 g  · Choose: Nonfat frozen yogurt (1 cup) / 0.3 g  · Choose: Frozen fruit bar / trace  · Avoid: Whipped cream (1 tbs) / 3.5 g  · Choose: Nondairy whipped topping (1 tbs) / 1 g  Condiments / Saturated Fat (g)  · Avoid: Mayonnaise (1 tbs) / 2 g  · Choose: Low-fat mayonnaise (1 tbs) / 1 g  · Avoid: Butter (1 tbs) / 7 g  · Choose: Extra light margarine (1 tbs) / 1 g  · Avoid: Coconut oil (1 tbs) / 11.8 g  · Choose: Olive oil (1 tbs) / 1.8 g  · Choose: Corn oil (1 tbs) / 1.7 g  · Choose: Safflower oil (1 tbs) / 1.2 g  · Choose: Sunflower oil (1 tbs) / 1.4 g  · Choose: Soybean oil (1 tbs) / 2.4 g  · Choose: Canola oil (1 tbs) / 1 g  Document Released: 04/01/2005 Document Revised: 07/27/2012 Document Reviewed: 09/20/2010  [employer]® Patient Information ©2014 [employer].

## 2013-04-14 ENCOUNTER — Telehealth: Payer: Self-pay | Admitting: *Deleted

## 2013-04-14 LAB — VITAMIN D 25 HYDROXY (VIT D DEFICIENCY, FRACTURES): Vit D, 25-Hydroxy: 55 ng/mL (ref 30–89)

## 2013-04-14 NOTE — Telephone Encounter (Signed)
Message copied by [facility], Gurvir Schrom A on Wed Apr 14, 2013 10:00 AM ------      Message from: [place], [place] R      Created: Wed Apr 14, 2013  5:39 AM       Add 250 mg Magnesium. Cholesterol horrible need strict diet no meat/ cheese/ dairy. Cardio QD. Try Flax seed 1000mg  QD, refuses statins/ RYRE due to myalgias. ADvise can try Welchol or Zetia if has not tried in the past due to increase risk for CV event with high levels, can come get SX if desires. Needs 43m OV MCK ------

## 2013-04-16 NOTE — Telephone Encounter (Signed)
Spoke with patient about recent lab results and instructions.  Declines Zetia or Welchol samples.  Wants to try Flaxseed 1000mg  and recheck in 3 months.  Patient states she needs to call back to schedule follow up.

## 2013-06-17 ENCOUNTER — Other Ambulatory Visit: Payer: Self-pay | Admitting: Internal Medicine

## 2013-08-31 ENCOUNTER — Other Ambulatory Visit: Payer: Self-pay

## 2013-08-31 DIAGNOSIS — Z1231 Encounter for screening mammogram for malignant neoplasm of breast: Secondary | ICD-10-CM

## 2013-09-28 ENCOUNTER — Encounter: Payer: Self-pay | Admitting: Emergency Medicine

## 2013-10-08 ENCOUNTER — Ambulatory Visit
Admission: RE | Admit: 2013-10-08 | Discharge: 2013-10-08 | Disposition: A | Discharge status: Home / Self Care | Payer: BC Managed Care – PPO | Source: Ambulatory Visit

## 2013-10-08 DIAGNOSIS — Z1231 Encounter for screening mammogram for malignant neoplasm of breast: Secondary | ICD-10-CM

## 2013-12-02 ENCOUNTER — Encounter: Payer: Self-pay | Admitting: Emergency Medicine

## 2013-12-07 ENCOUNTER — Ambulatory Visit (INDEPENDENT_AMBULATORY_CARE_PROVIDER_SITE_OTHER): Payer: BC Managed Care – PPO | Admitting: Physician Assistant

## 2013-12-07 ENCOUNTER — Encounter: Payer: Self-pay | Admitting: Physician Assistant

## 2013-12-07 VITALS — BP 128/70 | HR 68 | Temp 98.6°F | Resp 16 | Ht 63.5 in | Wt 129.0 lb

## 2013-12-07 DIAGNOSIS — I10 Essential (primary) hypertension: Secondary | ICD-10-CM

## 2013-12-07 DIAGNOSIS — E559 Vitamin D deficiency, unspecified: Secondary | ICD-10-CM

## 2013-12-07 DIAGNOSIS — M8580 Other specified disorders of bone density and structure, unspecified site: Secondary | ICD-10-CM

## 2013-12-07 DIAGNOSIS — M949 Disorder of cartilage, unspecified: Secondary | ICD-10-CM

## 2013-12-07 DIAGNOSIS — M899 Disorder of bone, unspecified: Secondary | ICD-10-CM

## 2013-12-07 DIAGNOSIS — Z0000 Encounter for general adult medical examination without abnormal findings: Secondary | ICD-10-CM

## 2013-12-07 DIAGNOSIS — E538 Deficiency of other specified B group vitamins: Secondary | ICD-10-CM

## 2013-12-07 DIAGNOSIS — E785 Hyperlipidemia, unspecified: Secondary | ICD-10-CM

## 2013-12-07 DIAGNOSIS — Z23 Encounter for immunization: Secondary | ICD-10-CM

## 2013-12-07 LAB — CBC WITH DIFFERENTIAL/PLATELET
BASOS ABS: 0.1 10*3/uL (ref 0.0–0.1)
BASOS PCT: 1 % (ref 0–1)
EOS ABS: 0.3 10*3/uL (ref 0.0–0.7)
EOS PCT: 4 % (ref 0–5)
HEMATOCRIT: 38.3 % (ref 36.0–46.0)
Hemoglobin: 13 g/dL (ref 12.0–15.0)
LYMPHS PCT: 39 % (ref 12–46)
Lymphs Abs: 3.2 10*3/uL (ref 0.7–4.0)
MCH: 30.7 pg (ref 26.0–34.0)
MCHC: 33.9 g/dL (ref 30.0–36.0)
MCV: 90.5 fL (ref 78.0–100.0)
MONO ABS: 0.6 10*3/uL (ref 0.1–1.0)
Monocytes Relative: 7 % (ref 3–12)
Neutro Abs: 4.1 10*3/uL (ref 1.7–7.7)
Neutrophils Relative %: 49 % (ref 43–77)
PLATELETS: 273 10*3/uL (ref 150–400)
RBC: 4.23 MIL/uL (ref 3.87–5.11)
RDW: 13.5 % (ref 11.5–15.5)
WBC: 8.3 10*3/uL (ref 4.0–10.5)

## 2013-12-07 LAB — HEMOGLOBIN A1C
Hgb A1c MFr Bld: 5.8 % — ABNORMAL HIGH (ref <5.7)
MEAN PLASMA GLUCOSE: 120 mg/dL — AB (ref <117)

## 2013-12-07 MED ORDER — NALOXEGOL OXALATE 25 MG PO TABS
25.0000 mg | ORAL_TABLET | Freq: Every day | ORAL | Status: DC
Start: 2013-12-07 — End: 2014-03-31

## 2013-12-07 NOTE — Patient Instructions (Addendum)
shingles vaccine- call your insurance and ask if they pay for it.   Use a dropper to put olive oil or canola oil in the effected ear- 2-3 times a week. Let it soak for 20-30 min then you can take a shower or use a baby bulb with warm water to wash out the ear wax.  Do not use Qtips  Benefiber is good for constipation/diarrhea/irritable bowel syndrome, it helps with weight loss and can help lower your bad cholesterol. Please do 1-2 TBSP in the morning in water, coffee, or tea. It can take up to a month before you can see a difference with your bowel movements. It is cheapest from [employer], [employer], [employer].   Preventative Care for Adults - Female      MAINTAIN REGULAR HEALTH EXAMS:  A routine yearly physical is a good way to check in with your primary care provider about your health and preventive screening. It is also an opportunity to share updates about your health and any concerns you have, and receive a thorough all-over exam.   Most health insurance companies pay for at least some preventative services.  Check with your health plan for specific coverages.  WHAT PREVENTATIVE SERVICES DO WOMEN NEED?  Adult women should have their weight and blood pressure checked regularly.   Women age 75 and older should have their cholesterol levels checked regularly.  Women should be screened for cervical cancer with a Pap smear and pelvic exam beginning at either age 36, or 3 years after they become sexually activity.    Breast cancer screening generally begins at age 53 with a mammogram and breast exam by your primary care provider.    Beginning at age 46 and continuing to age 59, women should be screened for colorectal cancer.  Certain people may need continued testing until age 39.  Updating vaccinations is part of preventative care.  Vaccinations help protect against diseases such as the flu.  Osteoporosis is a disease in which the bones lose minerals and strength as we age. Women ages 33 and over  should discuss this with their caregivers, as should women after menopause who have other risk factors.  Lab tests are generally done as part of preventative care to screen for anemia and blood disorders, to screen for problems with the kidneys and liver, to screen for bladder problems, to check blood sugar, and to check your cholesterol level.  Preventative services generally include counseling about diet, exercise, avoiding tobacco, drugs, excessive alcohol consumption, and sexually transmitted infections.    GENERAL RECOMMENDATIONS FOR GOOD HEALTH:  Healthy diet:  Eat a variety of foods, including fruit, vegetables, animal or vegetable protein, such as meat, fish, chicken, and eggs, or beans, lentils, tofu, and grains, such as rice.  Drink plenty of water daily.  Decrease saturated fat in the diet, avoid lots of red meat, processed foods, sweets, fast foods, and fried foods.  Exercise:  Aerobic exercise helps maintain good heart health. At least 30-40 minutes of moderate-intensity exercise is recommended. For example, a brisk walk that increases your heart rate and breathing. This should be done on most days of the week.   Find a type of exercise or a variety of exercises that you enjoy so that it becomes a part of your daily life.  Examples are running, walking, swimming, water aerobics, and biking.  For motivation and support, explore group exercise such as aerobic class, spin class, Zumba, Yoga,or  martial arts, etc.    Set exercise goals  for yourself, such as a certain weight goal, walk or run in a race such as a 5k walk/run.  Speak to your primary care provider about exercise goals.  Disease prevention:  If you smoke or chew tobacco, find out from your caregiver how to quit. It can literally save your life, no matter how long you have been a tobacco user. If you do not use tobacco, never begin.   Maintain a healthy diet and normal weight. Increased weight leads to problems with  blood pressure and diabetes.   The Body Mass Index or BMI is a way of measuring how much of your body is fat. Having a BMI above 27 increases the risk of heart disease, diabetes, hypertension, stroke and other problems related to obesity. Your caregiver can help determine your BMI and based on it develop an exercise and dietary program to help you achieve or maintain this important measurement at a healthful level.  High blood pressure causes heart and blood vessel problems.  Persistent high blood pressure should be treated with medicine if weight loss and exercise do not work.   Fat and cholesterol leaves deposits in your arteries that can block them. This causes heart disease and vessel disease elsewhere in your body.  If your cholesterol is found to be high, or if you have heart disease or certain other medical conditions, then you may need to have your cholesterol monitored frequently and be treated with medication.   Ask if you should have a cardiac stress test if your history suggests this. A stress test is a test done on a treadmill that looks for heart disease. This test can find disease prior to there being a problem.  Menopause can be associated with physical symptoms and risks. Hormone replacement therapy is available to decrease these. You should talk to your caregiver about whether starting or continuing to take hormones is right for you.   Osteoporosis is a disease in which the bones lose minerals and strength as we age. This can result in serious bone fractures. Risk of osteoporosis can be identified using a bone density scan. Women ages 32 and over should discuss this with their caregivers, as should women after menopause who have other risk factors. Ask your caregiver whether you should be taking a calcium supplement and Vitamin D, to reduce the rate of osteoporosis.   Avoid drinking alcohol in excess (more than two drinks per day).  Avoid use of street drugs. Do not share needles with  anyone. Ask for professional help if you need assistance or instructions on stopping the use of alcohol, cigarettes, and/or drugs.  Brush your teeth twice a day with fluoride toothpaste, and floss once a day. Good oral hygiene prevents tooth decay and gum disease. The problems can be painful, unattractive, and can cause other health problems. Visit your dentist for a routine oral and dental check up and preventive care every 6-12 months.   Look at your skin regularly.  Use a mirror to look at your back. Notify your caregivers of changes in moles, especially if there are changes in shapes, colors, a size larger than a pencil eraser, an irregular border, or development of new moles.  Safety:  Use seatbelts 100% of the time, whether driving or as a passenger.  Use safety devices such as hearing protection if you work in environments with loud noise or significant background noise.  Use safety glasses when doing any work that could send debris in to the eyes.  Use  a helmet if you ride a bike or motorcycle.  Use appropriate safety gear for contact sports.  Talk to your caregiver about gun safety.  Use sunscreen with a SPF (or skin protection factor) of 15 or greater.  Lighter skinned people are at a greater risk of skin cancer. Don't forget to also wear sunglasses in order to protect your eyes from too much damaging sunlight. Damaging sunlight can accelerate cataract formation.   Practice safe sex. Use condoms. Condoms are used for birth control and to help reduce the spread of sexually transmitted infections (or STIs).  Some of the STIs are gonorrhea (the clap), chlamydia, syphilis, trichomonas, herpes, HPV (human papilloma virus) and HIV (human immunodeficiency virus) which causes AIDS. The herpes, HIV and HPV are viral illnesses that have no cure. These can result in disability, cancer and death.   Keep carbon monoxide and smoke detectors in your home functioning at all times. Change the batteries every  6 months or use a model that plugs into the wall.   Vaccinations:  Stay up to date with your tetanus shots and other required immunizations. You should have a booster for tetanus every 10 years. Be sure to get your flu shot every year, since 5%-20% of the [place] population comes down with the flu. The flu vaccine changes each year, so being vaccinated once is not enough. Get your shot in the fall, before the flu season peaks.   Other vaccines to consider:  Human Papilloma Virus or HPV causes cancer of the cervix, and other infections that can be transmitted from person to person. There is a vaccine for HPV, and females should get immunized between the ages of 83 and 62. It requires a series of 3 shots.   Pneumococcal vaccine to protect against certain types of pneumonia.  This is normally recommended for adults age 67 or older.  However, adults younger than 55 years old with certain underlying conditions such as diabetes, heart or lung disease should also receive the vaccine.  Shingles vaccine to protect against Varicella Zoster if you are older than age 71, or younger than 55 years old with certain underlying illness.  Hepatitis A vaccine to protect against a form of infection of the liver by a virus acquired from food.  Hepatitis B vaccine to protect against a form of infection of the liver by a virus acquired from blood or body fluids, particularly if you work in health care.  If you plan to travel internationally, check with your local health department for specific vaccination recommendations.  Cancer Screening:  Breast cancer screening is essential to preventive care for women. All women age 10 and older should perform a breast self-exam every month. At age 67 and older, women should have their caregiver complete a breast exam each year. Women at ages 40 and older should have a mammogram (x-ray film) of the breasts. Your caregiver can discuss how often you need mammograms.    Cervical  cancer screening includes taking a Pap smear (sample of cells examined under a microscope) from the cervix (end of the uterus). It also includes testing for HPV (Human Papilloma Virus, which can cause cervical cancer). Screening and a pelvic exam should begin at age 39, or 3 years after a woman becomes sexually active. Screening should occur every year, with a Pap smear but no HPV testing, up to age 86. After age 64, you should have a Pap smear every 3 years with HPV testing, if no HPV was found previously.  Most routine colon cancer screening begins at the age of 63. On a yearly basis, doctors may provide special easy to use take-home tests to check for hidden blood in the stool. Sigmoidoscopy or colonoscopy can detect the earliest forms of colon cancer and is life saving. These tests use a small camera at the end of a tube to directly examine the colon. Speak to your caregiver about this at age 73, when routine screening begins (and is repeated every 5 years unless early forms of pre-cancerous polyps or small growths are found).

## 2013-12-07 NOTE — Progress Notes (Signed)
Complete Physical  Assessment and Plan: 1. Essential hypertension - continue medications, DASH diet, exercise and monitor at home. Call if greater than 130/80.   2. Osteopenia Continue CA and Vitamin D, declines DEXA at this time, will get next year  3. Hyperlipidemia - try benefiber, if still elevated in 3 months will consider Praluent, check lipids, decrease fatty foods, increase activity.   4. VITAMIN B12 DEFICIENCY Check level  5. Vitamin D deficiency Continue supplement, check vitamin D  6. Routine general medical examination at a health care facility - CBC with Differential - BASIC METABOLIC PANEL WITH GFR - Hepatic function panel - Lipid panel - TSH - Hemoglobin A1c - Insulin, fasting - Vit D  25 hydroxy (rtn osteoporosis monitoring) - Urinalysis, Routine w reflex microscopic - Microalbumin / creatinine urine ratio - Vitamin B12 - Magnesium - Iron and TIBC - Ferritin - EKG 12-Lead  7. Need for prophylactic vaccination against Streptococcus pneumoniae (pneumococcus) Prevnar 13   Discussed med's effects and SE's. Screening labs and tests as requested with regular follow-up as recommended.  HPI 53 y.o. female  presents for a complete physical. 30+ pack history smoking but since she is not working to cut cost she is not smoking anymore.   Her blood pressure has been controlled at home, today their BP is BP: 128/70 mmHg She does not workout. She denies chest pain, shortness of breath, dizziness.  She is not on cholesterol medication, she is intolerant to statins and has tried zetia but states she continued to have myalgias. Her cholesterol is not at goal. The cholesterol last visit was:   Lab Results  Component Value Date   CHOL 267* 04/13/2013   HDL 56 04/13/2013   LDLCALC 182* 04/13/2013   TRIG 145 04/13/2013   CHOLHDL 4.8 04/13/2013   Last A1C in the office was: 5.6 Patient is on Vitamin D supplement.   Lab Results  Component Value Date   VD25OH 41  04/13/2013     She is on disability, has DDD lumbar/cervical and gets ESI injections every 3 months with Dr. Nelva Bush who also provides her with the oxycodone PRN and left RTC tear, getting surgery for this with Dr. Alma Friendly.  She is on 800mg  Ibuprofen as well which helps.   Current Medications:  Current Outpatient Prescriptions on File Prior to Visit  Medication Sig Dispense Refill  . ALPRAZolam (XANAX) 0.5 MG tablet Take 0.5 mg by mouth at bedtime as needed for anxiety.      Marland Kitchen amLODipine (NORVASC) 5 MG tablet Take 5 mg by mouth at bedtime.       Marland Kitchen aspirin 81 MG tablet Take 81 mg by mouth daily.      Marland Kitchen atenolol (TENORMIN) 50 MG tablet Take 50 mg by mouth every evening.       . cholecalciferol (VITAMIN D) 1000 UNITS tablet Take 1,000 Units by mouth every evening.       . fluticasone (FLONASE) 50 MCG/ACT nasal spray Place into both nostrils as needed for allergies or rhinitis.      Marland Kitchen gabapentin (NEURONTIN) 400 MG capsule Take 1,200 mg by mouth 3 (three) times daily.       Marland Kitchen ibuprofen (ADVIL,MOTRIN) 800 MG tablet TAKE 1 TABLET BY MOUTH EVERY 8 HOURS AS NEEDED FOR PAIN  100 tablet  PRN  . oxyCODONE-acetaminophen (PERCOCET) 10-325 MG per tablet Take 1 tablet by mouth every 8 (eight) hours as needed for pain.       No current facility-administered medications on  file prior to visit.   Health Maintenance:   Immunization History  Administered Date(s) Administered  . Pneumococcal-Unspecified 04/15/2008  . Tdap 09/01/2012   Tetanus: 2014 Pneumovax: 2010 Flu vaccine: Zostavax: Pap: 2014 normal goes to GYN LMP 1999 MGM: 09/2013 DEXA: 2013 + osteopenia Colonoscopy: 2011 not due until 2021 EGD: CXR: 2014 normal, states it is not under her CPE  Patient Care Team: Unk Pinto, MD as PCP - General (Internal Medicine) Melissa Noon, [place] as Referring Physician (Optometry) Cindee Salt, MD as Consulting Physician (Physical Medicine and Rehabilitation) Sable Feil, MD as Consulting Physician  (Gastroenterology) Johna Sheriff, MD as Consulting Physician (Ophthalmology)    Allergies:  Allergies  Allergen Reactions  . Citalopram     No relief  . Crestor [Rosuvastatin]     Muscle aches  . Erythromycin Hives  . Lipitor [Atorvastatin]     Muscle aches  . Prednisone     Dyspnea with High Dose Prednisone  . Zoloft [Sertraline Hcl]     jittery   Medical History:  Past Medical History  Diagnosis Date  . AC (acromioclavicular) joint bone spurs   . Hypertension   . Hyperlipidemia   . Degenerative lumbar disc   . Anxiety   . Osteopenia   . COPD (chronic obstructive pulmonary disease)   . Endometriosis   . Vitamin D deficiency    Surgical History:  Past Surgical History  Procedure Laterality Date  . Abdominal hysterectomy    . Ankle fracture Right 2009  . Leep    . Breast surgery Right     Bx-benign  . Laproscopy for endometriosis     Family History:  Family History  Problem Relation Age of Onset  . Hypertension Mother   . Cancer Mother     Breast   Social History:  History  Substance Use Topics  . Smoking status: Current Some Day Smoker -- 1.00 packs/day    Types: Cigarettes  . Smokeless tobacco: Not on file  . Alcohol Use: No    Review of Systems: [X]  = complains of  [ ]  = denies  General: Fatigue [ ]  Fever [ ]  Chills [ ]  Weakness [ ]   Insomnia [ ] Weight change [ ]  Night sweats [ ]   Change in appetite [ ]  Eyes: Redness [ ]  Blurred vision [ ]  Diplopia [ ]  Discharge [ ]   ENT: Congestion [ ]  Sinus Pain [ ]  Post Nasal Drip [ ]  Sore Throat [ ]  Earache [ ]  hearing loss [ ]  Tinnitus [ ]  Snoring [ ]   Cardiac: Chest pain/pressure [ ]  SOB [ ]  Orthopnea [ ]   Palpitations [ ]   Paroxysmal nocturnal dyspnea[ ]  Claudication [ ]  Edema [ ]   Pulmonary: Cough [ ]  Wheezing[ ]   SOB [ ]   Pleurisy [ ]   GI: Nausea [ ]  Vomiting[ ]  Dysphagia[ ]  Heartburn[ ]  Abdominal pain [ ]  Constipation [ ] ; Diarrhea [ ]  BRBPR [ ]  Melena[ ]  Bloating [ ]  Hemorrhoids [ ]   GU: Hematuria[ ]   Dysuria [ ]  Nocturia[ ]  Urgency [ ]   Hesitancy [ ]  Discharge [ ]  Frequency [ ]   Breast:  Breast lumps [ ]   nipple discharge [ ]    Neuro: Headaches[ ]  Vertigo[ ]  Paresthesias[ ]  Spasm [ ]  Speech changes [ ]  Incoordination [ ]   Ortho: Arthritis [ ]  Joint pain [ ]  Muscle pain [ ]  Joint swelling [ ]  Back Pain [ ]  Skin:  Rash [ ]   Pruritis [ ]  Change in skin lesion [ ]   Psych: Depression[ ]  Anxiety[ ]  Confusion [ ]  Memory loss [ ]   Heme/Lypmh: Bleeding [ ]  Bruising [ ]  Enlarged lymph nodes [ ]   Endocrine: Visual blurring [ ]  Paresthesia [ ]  Polyuria [ ]  Polydypsea [ ]    Heat/cold intolerance [ ]  Hypoglycemia [ ]   Physical Exam: Estimated body mass index is 22.49 kg/(m^2) as calculated from the following:   Height as of this encounter: 5' 3.5" (1.613 m).   Weight as of this encounter: 129 lb (58.514 kg). BP 128/70  Pulse 68  Temp(Src) 98.6 F (37 C) (Temporal)  Resp 16  Ht 5' 3.5" (1.613 m)  Wt 129 lb (58.514 kg)  BMI 22.49 kg/m2 General Appearance: Well nourished, in no apparent distress. Eyes: PERRLA, EOMs, conjunctiva no swelling or erythema, normal fundi and vessels. Sinuses: No Frontal/maxillary tenderness ENT/Mouth: Ext aud canals clear, normal light reflex with TMs without erythema, bulging.  Good dentition. No erythema, swelling, or exudate on post pharynx. Tonsils not swollen or erythematous. Hearing normal.  Neck: Supple, thyroid normal. No bruits Respiratory: Respiratory effort normal, BS equal bilaterally without rales, rhonchi, wheezing or stridor. Cardio: RRR without murmurs, rubs or gallops. Brisk peripheral pulses without edema.  Chest: symmetric, with normal excursions and percussion. Breasts: defer Abdomen: Soft, +BS. Non tender, no guarding, rebound, hernias, masses, or organomegaly. .  Lymphatics: Non tender without lymphadenopathy.  Genitourinary: defer Musculoskeletal: Full ROM all peripheral extremities,5/5 strength, and normal gait. Skin: Warm, dry without rashes,  lesions, ecchymosis.  Neuro: Cranial nerves intact, reflexes equal bilaterally. Normal muscle tone, no cerebellar symptoms. Sensation intact.  Psych: Awake and oriented X 3, normal affect, Insight and Judgment appropriate.   EKG: WNL no changes.   Vicie Mutters 3:17 PM

## 2013-12-08 LAB — IRON AND TIBC
%SAT: 17 % — AB (ref 20–55)
IRON: 51 ug/dL (ref 42–145)
TIBC: 308 ug/dL (ref 250–470)
UIBC: 257 ug/dL (ref 125–400)

## 2013-12-08 LAB — URINALYSIS, ROUTINE W REFLEX MICROSCOPIC
BILIRUBIN URINE: NEGATIVE
Glucose, UA: NEGATIVE mg/dL
HGB URINE DIPSTICK: NEGATIVE
Ketones, ur: NEGATIVE mg/dL
Leukocytes, UA: NEGATIVE
Nitrite: NEGATIVE
PROTEIN: NEGATIVE mg/dL
Specific Gravity, Urine: 1.015 (ref 1.005–1.030)
UROBILINOGEN UA: 0.2 mg/dL (ref 0.0–1.0)
pH: 6 (ref 5.0–8.0)

## 2013-12-08 LAB — LIPID PANEL
CHOL/HDL RATIO: 6.3 ratio
CHOLESTEROL: 209 mg/dL — AB (ref 0–200)
HDL: 33 mg/dL — AB (ref >39)
LDL Cholesterol: 112 mg/dL — ABNORMAL HIGH (ref 0–99)
TRIGLYCERIDES: 318 mg/dL — AB (ref <150)
VLDL: 64 mg/dL — ABNORMAL HIGH (ref 0–40)

## 2013-12-08 LAB — HEPATIC FUNCTION PANEL
ALBUMIN: 4.7 g/dL (ref 3.5–5.2)
ALK PHOS: 49 U/L (ref 39–117)
ALT: 10 U/L (ref 0–35)
AST: 16 U/L (ref 0–37)
BILIRUBIN TOTAL: 0.2 mg/dL (ref 0.2–1.2)
Bilirubin, Direct: <0.1 mg/dL (ref 0.0–0.3)
TOTAL PROTEIN: 6.5 g/dL (ref 6.0–8.3)

## 2013-12-08 LAB — FERRITIN: Ferritin: 82 ng/mL (ref 10–291)

## 2013-12-08 LAB — BASIC METABOLIC PANEL WITH GFR
BUN: 8 mg/dL (ref 6–23)
CALCIUM: 9.4 mg/dL (ref 8.4–10.5)
CHLORIDE: 105 meq/L (ref 96–112)
CO2: 28 mEq/L (ref 19–32)
CREATININE: 0.54 mg/dL (ref 0.50–1.10)
Glucose, Bld: 81 mg/dL (ref 70–99)
Potassium: 4 mEq/L (ref 3.5–5.3)
Sodium: 140 mEq/L (ref 135–145)

## 2013-12-08 LAB — VITAMIN B12: Vitamin B-12: 222 pg/mL (ref 211–911)

## 2013-12-08 LAB — INSULIN, FASTING: INSULIN FASTING, SERUM: 2.8 u[IU]/mL (ref 2.0–19.6)

## 2013-12-08 LAB — TSH: TSH: 1.312 u[IU]/mL (ref 0.350–4.500)

## 2013-12-08 LAB — VITAMIN D 25 HYDROXY (VIT D DEFICIENCY, FRACTURES): Vit D, 25-Hydroxy: 74 ng/mL (ref 30–89)

## 2013-12-08 LAB — MAGNESIUM: Magnesium: 1.9 mg/dL (ref 1.5–2.5)

## 2013-12-09 LAB — MICROALBUMIN / CREATININE URINE RATIO
Creatinine, Urine: 105.6 mg/dL
Microalb Creat Ratio: 4.7 mg/g (ref 0.0–30.0)
Microalb, Ur: 0.5 mg/dL (ref 0.00–1.89)

## 2013-12-15 ENCOUNTER — Other Ambulatory Visit: Payer: Self-pay | Admitting: *Deleted

## 2013-12-15 MED ORDER — FLUTICASONE PROPIONATE 50 MCG/ACT NA SUSP: 2.0000 | NASAL | Status: DC | PRN

## 2013-12-21 ENCOUNTER — Other Ambulatory Visit: Payer: Self-pay | Admitting: *Deleted

## 2013-12-21 MED ORDER — FLUTICASONE PROPIONATE 50 MCG/ACT NA SUSP: 2.0000 | NASAL | Status: DC | PRN

## 2014-01-04 ENCOUNTER — Other Ambulatory Visit: Payer: Self-pay | Admitting: *Deleted

## 2014-01-04 MED ORDER — AMLODIPINE BESYLATE 5 MG PO TABS
5.0000 mg | ORAL_TABLET | Freq: Every day | ORAL | Status: DC
Start: 2014-01-04 — End: 2014-05-16

## 2014-01-04 MED ORDER — ATENOLOL 50 MG PO TABS
50.0000 mg | ORAL_TABLET | Freq: Every evening | ORAL | Status: DC
Start: 2014-01-04 — End: 2014-05-16

## 2014-03-31 ENCOUNTER — Ambulatory Visit (INDEPENDENT_AMBULATORY_CARE_PROVIDER_SITE_OTHER): Payer: BC Managed Care – PPO | Admitting: Physician Assistant

## 2014-03-31 ENCOUNTER — Encounter: Payer: Self-pay | Admitting: Physician Assistant

## 2014-03-31 VITALS — BP 110/64 | HR 64 | Temp 98.0°F | Resp 16 | Ht 63.5 in | Wt 120.0 lb

## 2014-03-31 DIAGNOSIS — E538 Deficiency of other specified B group vitamins: Secondary | ICD-10-CM

## 2014-03-31 DIAGNOSIS — R7309 Other abnormal glucose: Secondary | ICD-10-CM

## 2014-03-31 DIAGNOSIS — E785 Hyperlipidemia, unspecified: Secondary | ICD-10-CM

## 2014-03-31 DIAGNOSIS — Z79899 Other long term (current) drug therapy: Secondary | ICD-10-CM

## 2014-03-31 DIAGNOSIS — M8580 Other specified disorders of bone density and structure, unspecified site: Secondary | ICD-10-CM

## 2014-03-31 DIAGNOSIS — E559 Vitamin D deficiency, unspecified: Secondary | ICD-10-CM

## 2014-03-31 DIAGNOSIS — I10 Essential (primary) hypertension: Secondary | ICD-10-CM

## 2014-03-31 DIAGNOSIS — R7303 Prediabetes: Secondary | ICD-10-CM

## 2014-03-31 LAB — CBC WITH DIFFERENTIAL/PLATELET
BASOS PCT: 1 % (ref 0–1)
Basophils Absolute: 0.1 10*3/uL (ref 0.0–0.1)
Eosinophils Absolute: 0.1 10*3/uL (ref 0.0–0.7)
Eosinophils Relative: 1 % (ref 0–5)
HEMATOCRIT: 38.8 % (ref 36.0–46.0)
Hemoglobin: 13.6 g/dL (ref 12.0–15.0)
Lymphocytes Relative: 38 % (ref 12–46)
Lymphs Abs: 3.3 10*3/uL (ref 0.7–4.0)
MCH: 31 pg (ref 26.0–34.0)
MCHC: 35.1 g/dL (ref 30.0–36.0)
MCV: 88.4 fL (ref 78.0–100.0)
MONOS PCT: 7 % (ref 3–12)
MPV: 11.2 fL (ref 9.4–12.4)
Monocytes Absolute: 0.6 10*3/uL (ref 0.1–1.0)
NEUTROS ABS: 4.6 10*3/uL (ref 1.7–7.7)
NEUTROS PCT: 53 % (ref 43–77)
Platelets: 279 10*3/uL (ref 150–400)
RBC: 4.39 MIL/uL (ref 3.87–5.11)
RDW: 13.7 % (ref 11.5–15.5)
WBC: 8.6 10*3/uL (ref 4.0–10.5)

## 2014-03-31 LAB — HEPATIC FUNCTION PANEL
ALT: 16 U/L (ref 0–35)
AST: 14 U/L (ref 0–37)
Albumin: 4.5 g/dL (ref 3.5–5.2)
Alkaline Phosphatase: 48 U/L (ref 39–117)
BILIRUBIN DIRECT: 0.1 mg/dL (ref 0.0–0.3)
BILIRUBIN INDIRECT: 0.3 mg/dL (ref 0.2–1.2)
Total Bilirubin: 0.4 mg/dL (ref 0.2–1.2)
Total Protein: 6.5 g/dL (ref 6.0–8.3)

## 2014-03-31 LAB — LIPID PANEL
CHOLESTEROL: 224 mg/dL — AB (ref 0–200)
HDL: 49 mg/dL (ref >39)
LDL Cholesterol: 148 mg/dL — ABNORMAL HIGH (ref 0–99)
TRIGLYCERIDES: 133 mg/dL (ref <150)
Total CHOL/HDL Ratio: 4.6 Ratio
VLDL: 27 mg/dL (ref 0–40)

## 2014-03-31 LAB — BASIC METABOLIC PANEL WITH GFR
BUN: 11 mg/dL (ref 6–23)
CALCIUM: 9.4 mg/dL (ref 8.4–10.5)
CO2: 28 mEq/L (ref 19–32)
Chloride: 103 mEq/L (ref 96–112)
Creat: 0.65 mg/dL (ref 0.50–1.10)
GLUCOSE: 95 mg/dL (ref 70–99)
Potassium: 4.3 mEq/L (ref 3.5–5.3)
SODIUM: 140 meq/L (ref 135–145)

## 2014-03-31 LAB — MAGNESIUM: MAGNESIUM: 1.8 mg/dL (ref 1.5–2.5)

## 2014-03-31 LAB — HEMOGLOBIN A1C
Hgb A1c MFr Bld: 5.9 % — ABNORMAL HIGH (ref <5.7)
Mean Plasma Glucose: 123 mg/dL — ABNORMAL HIGH (ref <117)

## 2014-03-31 LAB — TSH: TSH: 1.671 u[IU]/mL (ref 0.350–4.500)

## 2014-03-31 LAB — VITAMIN B12: Vitamin B-12: 265 pg/mL (ref 211–911)

## 2014-03-31 LAB — VITAMIN D 25 HYDROXY (VIT D DEFICIENCY, FRACTURES): VIT D 25 HYDROXY: 54 ng/mL (ref 30–100)

## 2014-03-31 NOTE — Patient Instructions (Addendum)
Use a dropper to put olive oil, mineral oil, or canola oil in the effected ear- 2-3 times a week. Let it soak for 20-30 min then you can take a shower or use a baby bulb with warm water to wash out the ear wax.  Do not use Qtips   Biceps Tendon Tendinitis (Proximal) and Tenosynovitis with Rehab Tendonitis and tenosynovitis involve inflammation of the tendon and the tendon lining (sheath). The proximal biceps tendon is vulnerable to tendonitis and tenosynovitis, which causes pain and discomfort in the front of the shoulder and upper arm. The tendon lining secretes a fluid that helps lubricate the tendon, allowing for proper function without pain. When the tendon and its lining become inflamed, the tendon can no longer glide smoothly, causing pain. The proximal biceps tendon connects the biceps muscle to two bones of the shoulder. It is important for proper function of the elbow and turning the palm upward (supination) using the wrist. Proximal biceps tendon tendinitis may include a grade 1 or 2 strain of the tendon. Grade 1 strains involve a slight pull of the tendon without signs of tearing and no observed tendon lengthening. There is also no loss of strength. Grade 2 strains involve small tears in the tendon fibers. The tendon or muscle is stretched and strength is usually decreased.  SYMPTOMS   Pain, tenderness, swelling, warmth, or redness over the front of the shoulder.  Pain that gets worse with shoulder and elbow use, especially against resistance.  Limited motion of the shoulder or elbow.  Crackling sound (crepitation) when the tendon or shoulder is moved or touched. CAUSES  The symptoms of biceps tendonitis are due to inflammation of the tendon. Inflammation may be caused by:  Strain from sudden increase in amount or intensity of activity.  Direct blow or injury to the elbow (uncommon).  Overuse or repetitive elbow bending or wrist rotation, particularly when turning the palm up, or  with elbow hyperextension. RISK INCREASES WITH:  Sports that involve contact or overhead arm activity (throwing sports, gymnastics, weightlifting, bodybuilding, rock climbing).  Heavy labor.  Poor strength and flexibility.  Failure to warm up properly before activity. PREVENTION  Warm up and stretch properly before activity.  Allow time for recovery between activities.  Maintain physical fitness:  Strength, flexibility, and endurance.  Cardiovascular fitness.  Learn and use proper exercise technique. PROGNOSIS  With proper treatment, proximal biceps tendon tendonitis and tenosynovitis is usually curable within 6 weeks. Healing is usually quicker if the cause was a direct blow, not overuse.  RELATED COMPLICATIONS   Longer healing time if not properly treated or if not given enough time to heal.  Chronically inflamed tendon that causes persistent pain with activity, that may progress to constant pain and potentially rupture of the tendon.  Recurring symptoms, especially if activity is resumed too soon or with overuse, a direct blow, or use of poor exercise technique. TREATMENT Treatment first involves ice and medicine, to reduce pain and inflammation. It is helpful to modify activities that cause pain, to reduce the chances of causing the condition to get worse. Strengthening and stretching exercises should be performed to promote proper use of the muscles of the shoulder. These exercises may be performed at home or with a therapist. Other treatments may be given such as ultrasound or heat therapy. A corticosteroid injection may be recommended to help reduce inflammation of the tendon lining. Surgery is usually not necessary. Sometimes, if symptoms last for greater than 6 months, surgery will  be advised to detach the tendon and re-insert it into the arm bone. Surgery to correct other shoulder problems that may be contributing to tendinitis may be advised before surgery for the  tendinitis itself.  MEDICATION  If pain medicine is needed, nonsteroidal anti-inflammatory medicines (aspirin and ibuprofen), or other minor pain relievers (acetaminophen), are often advised.  Do not take pain medicine for 7 days before surgery.  Prescription pain relievers may be given if your caregiver thinks they are needed. Use only as directed and only as much as you need.  Corticosteroid injections may be given. These injections should only be used on the most severe cases, as one can only receive a limited number of them. HEAT AND COLD   Cold treatment (icing) should be applied for 10 to 15 minutes every 2 to 3 hours for inflammation and pain, and immediately after activity that aggravates your symptoms. Use ice packs or an ice massage.  Heat treatment may be used before performing stretching and strengthening activities prescribed by your caregiver, physical therapist, or athletic trainer. Use a heat pack or a warm water soak. SEEK MEDICAL CARE IF:   Symptoms get worse or do not improve in 2 weeks, despite treatment.  New, unexplained symptoms develop. (Drugs used in treatment may produce side effects.) EXERCISES RANGE OF MOTION (ROM) AND EXERCISES - Biceps Tendon (Proximal) and Tenosynovitis These exercises may help you when beginning to rehabilitate your injury. Your symptoms may go away with or without further involvement from your physician, physical therapist, or athletic trainer. While completing these exercises, remember:   Restoring tissue flexibility helps normal motion to return to the joints. This allows healthier, less painful movement and activity.  An effective stretch should be held for at least 30 seconds.  A stretch should never be painful. You should only feel a gentle lengthening or release in the stretched tissue. STRETCH - Flexion, Standing  Stand with good posture. With an underhand grip on your right / left hand and an overhand grip on the opposite hand,  grasp a broomstick or cane so that your hands are a little more than shoulder width apart.  Keeping your right / left elbow straight and shoulder muscles relaxed, push the stick with your opposite hand to raise your right / left arm in front of your body and then overhead. Raise your arm until you feel a stretch in your right / left shoulder, but before you have increased shoulder pain.  Try to avoid shrugging your right / left shoulder as your arm rises, by keeping your shoulder blade tucked down and toward your mid-back spine. Hold for __________ seconds.  Slowly return to the starting position. Repeat __________ times. Complete this exercise __________ times per day. STRETCH - Abduction, Supine  Lie on your back. With an underhand grip on your right / left hand and an overhand grip on the opposite hand, grasp a broomstick or cane so that your hands are a little more than shoulder width apart.  Keeping your right / left elbow straight and shoulder muscles relaxed, push the stick with your opposite hand to raise your right / left arm out to the side of your body and then overhead. Raise your arm until you feel a stretch in your right / left shoulder, but before you have increased shoulder pain.  Try to avoid shrugging your right / left shoulder as your arm rises, by keeping your shoulder blade tucked down and toward your mid-back spine. Hold for __________ seconds.  Slowly return to the starting position. Repeat __________ times. Complete this exercise __________ times per day. ROM - Flexion, Active-Assisted  Lie on your back. You may bend your knees for comfort.  Grasp a broomstick or cane so your hands are about shoulder width apart. Your right / left hand should grip the end of the stick so that your hand is positioned "thumbs-up," as if you were about to shake hands.  Using your healthy arm to lead, raise your right / left arm overhead until you feel a gentle stretch in your shoulder.  Hold for __________ seconds.  Use the stick to assist in returning your right / left arm to its starting position. Repeat __________ times. Complete this exercise __________ times per day.  STRETCH - Flexion, Standing   Stand facing a wall. Walk your right / left fingers up the wall until you feel a moderate stretch in your shoulder. As your hand gets higher, you may need to step closer to the wall or use a door frame to walk through.  Try to avoid shrugging your right / left shoulder as your arm rises, by keeping your shoulder blade tucked down and toward your mid-back spine.  Hold for __________ seconds. Use your other hand, if needed, to ease out of the stretch and return to the starting position. Repeat __________ times. Complete this exercise __________ times per day.  ROM - Internal Rotation   Using underhand grips, grasp a stick behind your back with both hands.  While standing upright with good posture, slide the stick up your back until you feel a mild stretch in the front of your shoulder.  Hold for __________ seconds. Slowly return to your starting position. Repeat __________ times. Complete this exercise __________ times per day.  STRETCH - Internal Rotation  Place your right / left hand behind your back, palm-up.  Throw a towel or belt over your opposite shoulder. Grasp the towel with your right / left hand.  While keeping an upright posture, gently pull up on the towel until you feel a stretch in the front of your right / left shoulder.  Avoid shrugging your right / left shoulder as your arm rises, by keeping your shoulder blade tucked down and toward your mid-back spine.  Hold for __________ seconds. Release the stretch by lowering your opposite hand. Repeat __________ times. Complete this exercise __________ times per day. STRENGTHENING EXERCISES - Biceps Tendon Tendinitis (Proximal) and Tenosynovitis These exercises may help you regain your strength after your  physician has discontinued your restraint in a cast or brace. They may resolve your symptoms with or without further involvement from your physician, physical therapist or athletic trainer. While completing these exercises, remember:   Muscles can gain both the endurance and the strength needed for everyday activities through controlled exercises.  Complete these exercises as instructed by your physician, physical therapist or athletic trainer. Increase the resistance and repetitions only as guided.  You may experience muscle soreness or fatigue, but the pain or discomfort you are trying to eliminate should never worsen during these exercises. If this pain does get worse, stop and make sure you are following the directions exactly. If the pain is still present after adjustments, discontinue the exercise until you can discuss the trouble with your caregiver. STRENGTH - Elbow Flexors, Isometric  Stand or sit upright on a firm surface. Place your right / left arm so that your hand is palm-up and at the height of your waist.  Place your  opposite hand on top of your forearm. Gently push down as your right / left arm resists. Push as hard as you can with both arms, without causing any pain or movement at your right / left elbow. Hold this stationary position for __________ seconds.  Gradually release the tension in both arms. Allow your muscles to relax completely before repeating. Repeat __________ times. Complete this exercise __________ times per day. STRENGTH - Shoulder Flexion, Isometric  With good posture and facing a wall, stand or sit about 4-6 inches away.  Keeping your right / left elbow straight, gently press the top of your fist into the wall. Increase the pressure gradually until you are pressing as hard as you can, without shrugging your shoulder or increasing any shoulder discomfort.  Hold for __________ seconds.  Release the tension slowly. Relax your shoulder muscles completely  before you start the next repetition. Repeat __________ times. Complete this exercise __________ times per day.  STRENGTH - Elbow Flexors, Supinated  With good posture, stand or sit on a firm chair without armrests. Allow your right / left arm to rest at your side with your palm facing forward.  Holding a __________ weight, or gripping a rubber exercise band or tubing,  bring your hand toward your shoulder.  Allow your muscles to control the resistance as your hand returns to your side. Repeat __________ times. Complete this exercise __________ times per day.  STRENGTH - Shoulder Flexion  Stand or sit with good posture. Grasp a __________ weight, or an exercise band or tubing, so that your hand is "thumbs-up," like when you shake hands.  Slowly lift your right / left arm as far as you can, without increasing any shoulder pain. At first, many people can only raise their hand to shoulder height.  Avoid shrugging your right / left shoulder as your arm rises, by keeping your shoulder blade tucked down and toward your mid-back spine.  Hold for __________ seconds. Control the descent of your hand as you slowly return to your starting position. Repeat __________ times. Complete this exercise __________ times per day. Document Released: 04/01/2005 Document Revised: 06/24/2011 Document Reviewed: 07/14/2008 [facility] Patient Information 2015 [place], [place]. This information is not intended to replace advice given to you by your health care provider. Make sure you discuss any questions you have with your health care provider.

## 2014-03-31 NOTE — Progress Notes (Signed)
Assessment and Plan:  Hypertension: Continue medication, monitor blood pressure at home. Continue DASH diet.  Reminder to go to the ER if any CP, SOB, nausea, dizziness, severe HA, changes vision/speech, left arm numbness and tingling, and jaw pain. Cholesterol: Continue diet and exercise. Check cholesterol.  Pre-diabetes-Continue diet and exercise. Check A1C Vitamin D Def- check level and continue medications.  B12 def- on B complex will recheck.  Left bicep tendonitis without rupture-  exercises given, RICE, NSAIDS,suggest PT through Dr. Veverly Fells.  Smoking cessation-  commended patient for quitting and reviewed strategies for preventing relapses  Continue diet and meds as discussed. Further disposition pending results of labs.  HPI 53 y.o. female  30 pack year smoking history quit recently presents for 3 month follow up with hypertension, hyperlipidemia, prediabetes and vitamin D. Her blood pressure has been controlled at home, atenolol 50 and norvasc 5 mg, today their BP is BP: 110/64 mmHg She does workout, walks her dog a mile a day. She denies chest pain, shortness of breath, dizziness.  She is not on cholesterol medication, she is intolerant to statins and zetia due to myalgias, she is on Flaxseed, fish oil, and benefiber.  Her cholesterol is not at goal but better than before which was an LDL of 189. The cholesterol last visit was:   Lab Results  Component Value Date   CHOL 209* 12/07/2013   HDL 33* 12/07/2013   LDLCALC 112* 12/07/2013   TRIG 318* 12/07/2013   CHOLHDL 6.3 12/07/2013   She has been working on diet and exercise for prediabetes, and denies polydipsia, polyuria and visual disturbances. Last A1C in the office was:  Lab Results  Component Value Date   HGBA1C 5.8* 12/07/2013   Patient is on Vitamin D supplement.   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 She follows with Dr. Alma Friendly for her left shoulder, recent xray normal. .  She has a B12 def.  Lab Results  Component Value Date   [id number] 122 12/07/2013    Current Medications:  Current Outpatient Prescriptions on File Prior to Visit  Medication Sig Dispense Refill  . ALPRAZolam (XANAX) 0.5 MG tablet Take 0.5 mg by mouth at bedtime as needed for anxiety.    Marland Kitchen amLODipine (NORVASC) 5 MG tablet Take 1 tablet (5 mg total) by mouth at bedtime. 90 tablet 1  . aspirin 81 MG tablet Take 81 mg by mouth daily.    Marland Kitchen atenolol (TENORMIN) 50 MG tablet Take 1 tablet (50 mg total) by mouth every evening. 90 tablet 1  . cholecalciferol (VITAMIN D) 1000 UNITS tablet Take 1,000 Units by mouth every evening.     . Flaxseed, Linseed, (FLAX SEED OIL) 1000 MG CAPS Take 1,000 mg by mouth daily.    . fluticasone (FLONASE) 50 MCG/ACT nasal spray Place 2 sprays into both nostrils as needed for allergies or rhinitis. 16 g 3  . gabapentin (NEURONTIN) 400 MG capsule Take 1,200 mg by mouth 3 (three) times daily.     Marland Kitchen ibuprofen (ADVIL,MOTRIN) 800 MG tablet TAKE 1 TABLET BY MOUTH EVERY 8 HOURS AS NEEDED FOR PAIN 100 tablet PRN  . Naloxegol Oxalate (MOVANTIK) 25 MG TABS Take 25 mg by mouth daily. 30 tablet 3  . oxyCODONE-acetaminophen (PERCOCET) 10-325 MG per tablet Take 1 tablet by mouth every 8 (eight) hours as needed for pain.  No current facility-administered medications on file prior to visit.   Medical History:  Past Medical History  Diagnosis Date  . AC (acromioclavicular) joint bone spurs   . Hypertension   . Hyperlipidemia   . Degenerative lumbar disc   . Anxiety   . Osteopenia   . COPD (chronic obstructive pulmonary disease)   . Endometriosis   . Vitamin D deficiency    Allergies:  Allergies  Allergen Reactions  . Citalopram     No relief  . Crestor [Rosuvastatin]     Muscle aches  . Erythromycin Hives  . Lipitor [Atorvastatin]     Muscle aches  . Prednisone      Dyspnea with High Dose Prednisone  . Zoloft [Sertraline Hcl]     jittery    Review of Systems:  Review of Systems  Constitutional: Negative.   HENT: Positive for congestion. Negative for ear discharge, ear pain, hearing loss, nosebleeds, sore throat and tinnitus.   Eyes: Negative.   Respiratory: Negative.  Negative for stridor.   Cardiovascular: Negative.   Gastrointestinal: Positive for constipation. Negative for heartburn, nausea, vomiting, abdominal pain, diarrhea, blood in stool and melena.  Genitourinary: Negative.   Musculoskeletal: Positive for back pain, joint pain (left shoulder), falls (fall 3 months ago on left shoulder, negative Xray, following Dr. Veverly Fells. ) and neck pain. Negative for myalgias.  Skin: Negative.   Neurological: Negative.  Negative for headaches.  Psychiatric/Behavioral: Negative.     Family history- Review and unchanged Social history- Review and unchanged Physical Exam: BP 110/64 mmHg  Pulse 64  Temp(Src) 98 F (36.7 C)  Resp 16  Ht 5' 3.5" (1.613 m)  Wt 120 lb (54.432 kg)  BMI 20.92 kg/m2 Wt Readings from Last 3 Encounters:  03/31/14 120 lb (54.432 kg)  12/07/13 129 lb (58.514 kg)  04/13/13 125 lb (56.7 kg)   General Appearance: Well nourished, in no apparent distress. Eyes: PERRLA, EOMs, conjunctiva no swelling or erythema Sinuses: No Frontal/maxillary tenderness ENT/Mouth: Ext aud canals clear, TMs without erythema, bulging. No erythema, swelling, or exudate on post pharynx.  Tonsils not swollen or erythematous. Hearing normal.  Neck: Supple, thyroid normal.  Respiratory: Respiratory effort normal, BS equal bilaterally without rales, rhonchi, wheezing or stridor.  Cardio: RRR with no MRGs. Brisk peripheral pulses without edema.  Abdomen: Soft, + BS.  Non tender, no guarding, rebound, hernias, masses. Lymphatics: Non tender without lymphadenopathy.  Musculoskeletal: Full ROM, 5/5 strength, gait antalgic + bicep tendon pain with  palpation, pain with abduction to 90 degrees.  Skin: Warm, dry without rashes, lesions, ecchymosis.  Neuro: Cranial nerves intact. Normal muscle tone, no cerebellar symptoms.  Psych: Awake and oriented X 3, normal affect, Insight and Judgment appropriate.    Vicie Mutters, PA-C 8:48 AM [facility]

## 2014-05-16 ENCOUNTER — Other Ambulatory Visit: Payer: Self-pay | Admitting: Internal Medicine

## 2014-08-15 ENCOUNTER — Encounter: Payer: Self-pay | Admitting: Gastroenterology

## 2014-09-09 ENCOUNTER — Other Ambulatory Visit: Payer: Self-pay

## 2014-09-09 DIAGNOSIS — Z1231 Encounter for screening mammogram for malignant neoplasm of breast: Secondary | ICD-10-CM

## 2014-10-10 ENCOUNTER — Ambulatory Visit
Admission: RE | Admit: 2014-10-10 | Discharge: 2014-10-10 | Disposition: A | Discharge status: Home / Self Care | Payer: 59 | Source: Ambulatory Visit

## 2014-10-10 DIAGNOSIS — Z1231 Encounter for screening mammogram for malignant neoplasm of breast: Secondary | ICD-10-CM

## 2014-10-11 ENCOUNTER — Other Ambulatory Visit: Payer: Self-pay | Admitting: Obstetrics and Gynecology

## 2014-10-11 DIAGNOSIS — R928 Other abnormal and inconclusive findings on diagnostic imaging of breast: Secondary | ICD-10-CM

## 2014-10-18 ENCOUNTER — Ambulatory Visit
Admission: RE | Admit: 2014-10-18 | Discharge: 2014-10-18 | Disposition: A | Discharge status: Home / Self Care | Payer: 59 | Source: Ambulatory Visit | Attending: Obstetrics and Gynecology | Admitting: Obstetrics and Gynecology

## 2014-10-18 DIAGNOSIS — R928 Other abnormal and inconclusive findings on diagnostic imaging of breast: Secondary | ICD-10-CM

## 2014-12-20 ENCOUNTER — Encounter: Payer: Self-pay | Admitting: Physician Assistant

## 2015-09-04 ENCOUNTER — Emergency Department (HOSPITAL_COMMUNITY)
Admission: EM | Admit: 2015-09-04 | Discharge: 2015-09-04 | Disposition: A | Discharge status: Home / Self Care | Payer: Medicaid Other | Attending: Emergency Medicine | Admitting: Emergency Medicine

## 2015-09-04 ENCOUNTER — Emergency Department (HOSPITAL_COMMUNITY): Payer: Medicaid Other

## 2015-09-04 ENCOUNTER — Encounter (HOSPITAL_COMMUNITY): Payer: Self-pay | Admitting: Nurse Practitioner

## 2015-09-04 DIAGNOSIS — J449 Chronic obstructive pulmonary disease, unspecified: Secondary | ICD-10-CM | POA: Diagnosis not present

## 2015-09-04 DIAGNOSIS — R202 Paresthesia of skin: Secondary | ICD-10-CM | POA: Insufficient documentation

## 2015-09-04 DIAGNOSIS — Z7982 Long term (current) use of aspirin: Secondary | ICD-10-CM | POA: Diagnosis not present

## 2015-09-04 DIAGNOSIS — E785 Hyperlipidemia, unspecified: Secondary | ICD-10-CM | POA: Insufficient documentation

## 2015-09-04 DIAGNOSIS — I10 Essential (primary) hypertension: Secondary | ICD-10-CM | POA: Insufficient documentation

## 2015-09-04 DIAGNOSIS — R531 Weakness: Secondary | ICD-10-CM | POA: Diagnosis present

## 2015-09-04 DIAGNOSIS — Z79899 Other long term (current) drug therapy: Secondary | ICD-10-CM | POA: Insufficient documentation

## 2015-09-04 DIAGNOSIS — R519 Headache, unspecified: Secondary | ICD-10-CM

## 2015-09-04 DIAGNOSIS — R51 Headache: Secondary | ICD-10-CM | POA: Insufficient documentation

## 2015-09-04 DIAGNOSIS — F17210 Nicotine dependence, cigarettes, uncomplicated: Secondary | ICD-10-CM | POA: Insufficient documentation

## 2015-09-04 LAB — RAPID URINE DRUG SCREEN, HOSP PERFORMED
AMPHETAMINES: NOT DETECTED
BARBITURATES: NOT DETECTED
BENZODIAZEPINES: NOT DETECTED
Cocaine: NOT DETECTED
Opiates: NOT DETECTED
TETRAHYDROCANNABINOL: NOT DETECTED

## 2015-09-04 LAB — COMPREHENSIVE METABOLIC PANEL
ALT: 19 U/L (ref 14–54)
AST: 21 U/L (ref 15–41)
Albumin: 4.4 g/dL (ref 3.5–5.0)
Alkaline Phosphatase: 49 U/L (ref 38–126)
Anion gap: 6 (ref 5–15)
BUN: 12 mg/dL (ref 6–20)
CHLORIDE: 108 mmol/L (ref 101–111)
CO2: 24 mmol/L (ref 22–32)
CREATININE: 0.47 mg/dL (ref 0.44–1.00)
Calcium: 9 mg/dL (ref 8.9–10.3)
GFR calc Af Amer: >60 mL/min (ref >60)
GLUCOSE: 106 mg/dL — AB (ref 65–99)
Potassium: 4.1 mmol/L (ref 3.5–5.1)
SODIUM: 138 mmol/L (ref 135–145)
Total Bilirubin: 0.6 mg/dL (ref 0.3–1.2)
Total Protein: 6.7 g/dL (ref 6.5–8.1)

## 2015-09-04 LAB — I-STAT CHEM 8, ED
BUN: 12 mg/dL (ref 6–20)
CHLORIDE: 106 mmol/L (ref 101–111)
CREATININE: 0.5 mg/dL (ref 0.44–1.00)
Calcium, Ion: 1.19 mmol/L (ref 1.12–1.23)
GLUCOSE: 102 mg/dL — AB (ref 65–99)
HCT: 44 % (ref 36.0–46.0)
Hemoglobin: 15 g/dL (ref 12.0–15.0)
POTASSIUM: 4.2 mmol/L (ref 3.5–5.1)
Sodium: 141 mmol/L (ref 135–145)
TCO2: 24 mmol/L (ref 0–100)

## 2015-09-04 LAB — URINALYSIS, ROUTINE W REFLEX MICROSCOPIC
BILIRUBIN URINE: NEGATIVE
Glucose, UA: NEGATIVE mg/dL
HGB URINE DIPSTICK: NEGATIVE
Ketones, ur: NEGATIVE mg/dL
Leukocytes, UA: NEGATIVE
Nitrite: NEGATIVE
PROTEIN: NEGATIVE mg/dL
Specific Gravity, Urine: 1.008 (ref 1.005–1.030)
pH: 6 (ref 5.0–8.0)

## 2015-09-04 LAB — CBC
HEMATOCRIT: 42.4 % (ref 36.0–46.0)
Hemoglobin: 14.2 g/dL (ref 12.0–15.0)
MCH: 30.8 pg (ref 26.0–34.0)
MCHC: 33.5 g/dL (ref 30.0–36.0)
MCV: 92 fL (ref 78.0–100.0)
Platelets: 249 10*3/uL (ref 150–400)
RBC: 4.61 MIL/uL (ref 3.87–5.11)
RDW: 13.9 % (ref 11.5–15.5)
WBC: 9.2 10*3/uL (ref 4.0–10.5)

## 2015-09-04 LAB — DIFFERENTIAL
BASOS ABS: 0.1 10*3/uL (ref 0.0–0.1)
BASOS PCT: 1 %
Eosinophils Absolute: 0.2 10*3/uL (ref 0.0–0.7)
Eosinophils Relative: 2 %
Lymphocytes Relative: 28 %
Lymphs Abs: 2.6 10*3/uL (ref 0.7–4.0)
MONOS PCT: 7 %
Monocytes Absolute: 0.7 10*3/uL (ref 0.1–1.0)
NEUTROS ABS: 5.8 10*3/uL (ref 1.7–7.7)
Neutrophils Relative %: 62 %

## 2015-09-04 LAB — PROTIME-INR
INR: 1.03 (ref 0.00–1.49)
Prothrombin Time: 13.3 seconds (ref 11.6–15.2)

## 2015-09-04 LAB — ETHANOL

## 2015-09-04 LAB — I-STAT TROPONIN, ED: Troponin i, poc: 0 ng/mL (ref 0.00–0.08)

## 2015-09-04 LAB — APTT: APTT: 29 s (ref 24–37)

## 2015-09-04 MED ORDER — DIPHENHYDRAMINE HCL 50 MG/ML IJ SOLN
25.0000 mg | Freq: Once | INTRAMUSCULAR | Status: AC
  Administered 2015-09-04: 25 mg via INTRAVENOUS
  Filled 2015-09-04: qty 1

## 2015-09-04 MED ORDER — METOCLOPRAMIDE HCL 5 MG/ML IJ SOLN
10.0000 mg | Freq: Once | INTRAMUSCULAR | Status: AC
  Administered 2015-09-04: 10 mg via INTRAVENOUS
  Filled 2015-09-04: qty 2

## 2015-09-04 NOTE — ED Notes (Signed)
Patient transported to MRI 

## 2015-09-04 NOTE — Discharge Instructions (Signed)
Read the information below.  You may return to the Emergency Department at any time for worsening condition or any new symptoms that concern you.  Please make sure to take your aspirin and your daily medications every day.  Call your doctor today to schedule a close follow up appointment.   If you develop new or worsening symptoms, please go to your nearest Emergency Department.    General Headache Without Cause A headache is pain or discomfort felt around the head or neck area. The specific cause of a headache may not be found. There are many causes and types of headaches. A few common ones are:  Tension headaches.  Migraine headaches.  Cluster headaches.  Chronic daily headaches. HOME CARE INSTRUCTIONS  Watch your condition for any changes. Take these steps to help with your condition: Managing Pain  Take over-the-counter and prescription medicines only as told by your health care provider.  Lie down in a dark, quiet room when you have a headache.  If directed, apply ice to the head and neck area:  Put ice in a plastic bag.  Place a towel between your skin and the bag.  Leave the ice on for 20 minutes, 2-3 times per day.  Use a heating pad or hot shower to apply heat to the head and neck area as told by your health care provider.  Keep lights dim if bright lights bother you or make your headaches worse. Eating and Drinking  Eat meals on a regular schedule.  Limit alcohol use.  Decrease the amount of caffeine you drink, or stop drinking caffeine. General Instructions  Keep all follow-up visits as told by your health care provider. This is important.  Keep a headache journal to help find out what may trigger your headaches. For example, write down:  What you eat and drink.  How much sleep you get.  Any change to your diet or medicines.  Try massage or other relaxation techniques.  Limit stress.  Sit up straight, and do not tense your muscles.  Do not use  tobacco products, including cigarettes, chewing tobacco, or e-cigarettes. If you need help quitting, ask your health care provider.  Exercise regularly as told by your health care provider.  Sleep on a regular schedule. Get 7-9 hours of sleep, or the amount recommended by your health care provider. SEEK MEDICAL CARE IF:   Your symptoms are not helped by medicine.  You have a headache that is different from the usual headache.  You have nausea or you vomit.  You have a fever. SEEK IMMEDIATE MEDICAL CARE IF:   Your headache becomes severe.  You have repeated vomiting.  You have a stiff neck.  You have a loss of vision.  You have problems with speech.  You have pain in the eye or ear.  You have muscular weakness or loss of muscle control.  You lose your balance or have trouble walking.  You feel faint or pass out.  You have confusion.   This information is not intended to replace advice given to you by your health care provider. Make sure you discuss any questions you have with your health care provider.   Document Released: 04/01/2005 Document Revised: 12/21/2014 Document Reviewed: 07/25/2014 Elsevier Interactive Patient Education 2016 Elsevier Inc.  Paresthesia Paresthesia is an abnormal burning or prickling sensation. This sensation is generally felt in the hands, arms, legs, or feet. However, it may occur in any part of the body. Usually, it is not painful. The  feeling may be described as:  Tingling or numbness.  Pins and needles.  Skin crawling.  Buzzing.  Limbs falling asleep.  Itching. Most people experience temporary (transient) paresthesia at some time in their lives. Paresthesia may occur when you breathe too quickly (hyperventilation). It can also occur without any apparent cause. Commonly, paresthesia occurs when pressure is placed on a nerve. The sensation quickly goes away after the pressure is removed. For some people, however, paresthesia is a  long-lasting (chronic) condition that is caused by an underlying disorder. If you continue to have paresthesia, you may need further medical evaluation. HOME CARE INSTRUCTIONS Watch your condition for any changes. Taking the following actions may help to lessen any discomfort that you are feeling:  Avoid drinking alcohol.  Try acupuncture or massage to help relieve your symptoms.  Keep all follow-up visits as directed by your health care provider. This is important. SEEK MEDICAL CARE IF:  You continue to have episodes of paresthesia.  Your burning or prickling feeling gets worse when you walk.  You have pain, cramps, or dizziness.  You develop a rash. SEEK IMMEDIATE MEDICAL CARE IF:  You feel weak.  You have trouble walking or moving.  You have problems with speech, understanding, or vision.  You feel confused.  You cannot control your bladder or bowel movements.  You have numbness after an injury.  You faint.   This information is not intended to replace advice given to you by your health care provider. Make sure you discuss any questions you have with your health care provider.   Document Released: 03/22/2002 Document Revised: 08/16/2014 Document Reviewed: 03/28/2014 Elsevier Interactive Patient Education [employer].

## 2015-09-04 NOTE — ED Provider Notes (Signed)
CSN: [id number]     Arrival date & time 09/04/15  0700 History   First MD Initiated Contact with Patient 09/04/15 [phone]     Chief Complaint  Patient presents with  . Weakness  . Numbness     (Consider location/radiation/quality/duration/timing/severity/associated sxs/prior Treatment) The history is provided by the patient.     Pt presents with gradual onset headache that began around 5pm followed by left sided weakness and numbness that occurred around 5am.  States the headache was frontal and pulsating, improved since yesterday, currently 7/10 intensity.  She was awake this morning cooking breakfast when she developed left facial "sensation," left arm weakness and numbness, and left leg weakness.  She did not fall or have gait difficulties.  Currently these are improved with exception of persistent "sensation" in the left face ("not numbness") and left 4th and 5th finger numbness.  She checked herself in the mirror and saw no facial droop.  Took 3 aspirin this morning.  Took aleve last night in addition to her daily percocet she takes for chronic neck and back pain.  Denies fevers, change in neck symptoms, head trauma.    Past Medical History  Diagnosis Date  . AC (acromioclavicular) joint bone spurs   . Hypertension   . Hyperlipidemia   . Degenerative lumbar disc   . Anxiety   . Osteopenia   . COPD (chronic obstructive pulmonary disease) ([place])   . Endometriosis   . Vitamin D deficiency    Past Surgical History  Procedure Laterality Date  . Abdominal hysterectomy    . Ankle fracture Right 2009  . Leep    . Breast surgery Right     Bx-benign  . Laproscopy for endometriosis     Family History  Problem Relation Age of Onset  . Hypertension Mother   . Cancer Mother     Breast   Social History  Substance Use Topics  . Smoking status: Current Some Day Smoker -- 1.00 packs/day    Types: Cigarettes  . Smokeless tobacco: None  . Alcohol Use: No   OB History    No data  available     Review of Systems  All other systems reviewed and are negative.     Allergies  Citalopram; Crestor; Erythromycin; Lipitor; Prednisone; and Zoloft  Home Medications   Prior to Admission medications   Medication Sig Start Date End Date Taking? Authorizing Provider  ALPRAZolam Duanne Moron) 0.5 MG tablet Take 0.5 mg by mouth at bedtime as needed for anxiety.    Historical Provider, MD  amLODipine (NORVASC) 5 MG tablet TAKE 1 BY MOUTH AT BEDTIME 05/16/14   Unk Pinto, MD  aspirin 81 MG tablet Take 81 mg by mouth daily.    Historical Provider, MD  atenolol (TENORMIN) 50 MG tablet TAKE 1 BY MOUTH EVERY EVENING 05/16/14   Unk Pinto, MD  cholecalciferol (VITAMIN D) 1000 UNITS tablet Take 1,000 Units by mouth every evening.     Historical Provider, MD  Flaxseed, Linseed, (FLAX SEED OIL) 1000 MG CAPS Take 1,000 mg by mouth daily.    Historical Provider, MD  fluticasone (FLONASE) 50 MCG/ACT nasal spray Place 2 sprays into both nostrils as needed for allergies or rhinitis. 12/21/13   Unk Pinto, MD  gabapentin (NEURONTIN) 400 MG capsule Take 1,200 mg by mouth 3 (three) times daily.     Historical Provider, MD  ibuprofen (ADVIL,MOTRIN) 800 MG tablet TAKE 1 TABLET BY MOUTH EVERY 8 HOURS AS NEEDED FOR PAIN 06/17/13   Estill Bamberg  Silverio Lay, PA-C  Omega-3 Fatty Acids (FISH OIL PO) Take by mouth.    Historical Provider, MD  oxyCODONE-acetaminophen (PERCOCET) 10-325 MG per tablet Take 1 tablet by mouth every 8 (eight) hours as needed for pain.    Historical Provider, MD   BP 108/69 mmHg  Pulse 57  Temp(Src) 98 F (36.7 C) (Oral)  Resp 16  Ht 5\' 4"  (1.626 m)  Wt 54.432 kg  BMI 20.59 kg/m2  SpO2 96% Physical Exam  Constitutional: She appears well-developed and well-nourished. No distress.  HENT:  Head: Normocephalic and atraumatic.  Neck: Neck supple.  Cardiovascular: Normal rate and regular rhythm.   Pulmonary/Chest: Effort normal and breath sounds normal. No respiratory distress. She  has no wheezes. She has no rales.  Abdominal: Soft. She exhibits no distension. There is no tenderness. There is no rebound and no guarding.  Neurological: She is alert.  CN II-XII intact, EOMs intact, no pronator drift, grip strengths equal bilaterally; strength 5/5 in all extremities, sensation intact in all extremities; finger to nose, heel to shin, rapid alternating movements normal; gait is normal.     Skin: She is not diaphoretic.  Psychiatric: She has a normal mood and affect. Her behavior is normal.  Nursing note and vitals reviewed.   ED Course  Procedures (including critical care time) Labs Review Labs Reviewed  COMPREHENSIVE METABOLIC PANEL - Abnormal; Notable for the following:    Glucose, Bld 106 (*)    All other components within normal limits  I-STAT CHEM 8, ED - Abnormal; Notable for the following:    Glucose, Bld 102 (*)    All other components within normal limits  ETHANOL  PROTIME-INR  APTT  CBC  DIFFERENTIAL  URINE RAPID DRUG SCREEN, HOSP PERFORMED  URINALYSIS, ROUTINE W REFLEX MICROSCOPIC (NOT AT [facility])  Randolm Idol, ED    Imaging Review Ct Head Wo Contrast  09/04/2015  CLINICAL DATA:  55 year old female with left-sided headache, left hand numbness and lip numbness. EXAM: CT HEAD WITHOUT CONTRAST TECHNIQUE: Contiguous axial images were obtained from the base of the skull through the vertex without intravenous contrast. COMPARISON:  Prior CT scan of the head 02/02/2009 FINDINGS: Negative for acute intracranial hemorrhage, acute infarction, mass, mass effect, hydrocephalus or midline shift. Gray-white differentiation is preserved throughout. Similar degree of central atrophy with ex vacuo ventriculomegaly slightly asymmetric with the left greater than the right. Mild focal hypoattenuation in the left corona radiata also remains unchanged and consistent with a remote infarct. No focal soft tissue or scalp abnormality. Globes and orbits are symmetric and intact  bilaterally. Normal aeration of the mastoid air cells and paranasal sinuses. IMPRESSION: 1. No acute intracranial abnormality. 2. Stable central atrophy with ex vacuo ventriculomegaly and remote left corona radiata infarct. Electronically Signed   By: Jacqulynn Cadet M.D.   On: 09/04/2015 08:49   Mr Brain Wo Contrast  09/04/2015  CLINICAL DATA:  Headache. Left-sided weakness and numbness that is resolved. EXAM: MRI HEAD WITHOUT CONTRAST TECHNIQUE: Multiplanar, multiecho pulse sequences of the brain and surrounding structures were obtained without intravenous contrast. COMPARISON:  08/30/2005 FINDINGS: Calvarium and upper cervical spine: No focal marrow signal abnormality. Orbits: Negative. Sinuses and Mastoids: Clear. Brain: Stable since prior. Volume loss and gliosis around the body of the left lateral ventricle attributed to remote white matter infarct. No notable ischemic changes elsewhere. 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 No acute infarct, hemorrhage, hydrocephalus, or mass lesion. Retro cerebellar CSF accumulation without  mass effect or vermian abnormality. No evidence of major vessel occlusion. IMPRESSION: 1. No acute finding or change since 2007. 2. Chronic findings are described above. Electronically Signed   By: Monte Fantasia M.D.   On: 09/04/2015 11:14   I have personally reviewed and evaluated these images and lab results as part of my medical decision-making.   EKG Interpretation   Date/Time:  Monday Sep 04 2015 08:18:47 EDT Ventricular Rate:  50 PR Interval:  146 QRS Duration: 106 QT Interval:  447 QTC Calculation: 408 R Axis:   55 Text Interpretation:  Sinus rhythm Abnormal R-wave progression, early  transition Borderline repolarization abnormality No significant change  since last tracing Confirmed by KNOTT MD, DANIEL [id number]) on 09/04/2015  8:24:51 AM       9:28 AM Headache currently 2/10.  Numbness and facial paresthesia  resolved.    9:55 AM I spoke with Etta Quill, PA, neurology, who recommends MRI.  If negative for acute stroke, may d/c home with PCP follow up.  If positive for stroke, will need to be admitted, transferred to [facility].    MDM   Final diagnoses:  Acute nonintractable headache, unspecified headache type  Paresthesia    Afebrile, nontoxic patient with gradual onset headache and left sided numbness/weakness, most likely TIA vs complex headache.  Pt nearly completely resolved symptoms prior to being seen and comletely resolved after reglan, benadryl.  CT and MRI significant for remote stroke and chronic changes only.  Labs unremarkebl.   Discussed pt with Dr Laneta Simmers and with neurology.  Educated patient regarding TIAs and need for further outpatient evaluation and treatment.  Pt d/c with very close PCP follow up, return precautions.  Discussed result, findings, treatment, and follow up  with patient.  Pt given return precautions.  Pt verbalizes understanding and agrees with plan.          Clayton Bibles, PA-C 09/04/15 1445  Leo Grosser, MD 09/04/15 [phone]

## 2015-09-04 NOTE — ED Notes (Signed)
Pt presents to [facility] with complaints of left sided headache, left hand numbness, and new lip numbness. She says that her symptoms appeared last night and have worsened this morning. She has recently had dental work to the left side and has a history of DDD in her neck. She feels this pain is different.

## 2015-09-12 ENCOUNTER — Other Ambulatory Visit: Payer: Self-pay

## 2015-09-12 DIAGNOSIS — Z1231 Encounter for screening mammogram for malignant neoplasm of breast: Secondary | ICD-10-CM

## 2015-10-23 ENCOUNTER — Ambulatory Visit
Admission: RE | Admit: 2015-10-23 | Discharge: 2015-10-23 | Disposition: A | Discharge status: Home / Self Care | Payer: Medicaid Other | Source: Ambulatory Visit

## 2015-10-23 ENCOUNTER — Ambulatory Visit: Payer: Self-pay

## 2015-10-23 DIAGNOSIS — Z1231 Encounter for screening mammogram for malignant neoplasm of breast: Secondary | ICD-10-CM

## 2015-12-25 DIAGNOSIS — Z6822 Body mass index (BMI) 22.0-22.9, adult: Secondary | ICD-10-CM | POA: Diagnosis not present

## 2015-12-25 DIAGNOSIS — J0101 Acute recurrent maxillary sinusitis: Secondary | ICD-10-CM | POA: Diagnosis not present

## 2016-05-31 DIAGNOSIS — Z6823 Body mass index (BMI) 23.0-23.9, adult: Secondary | ICD-10-CM | POA: Diagnosis not present

## 2016-05-31 DIAGNOSIS — J069 Acute upper respiratory infection, unspecified: Secondary | ICD-10-CM | POA: Diagnosis not present

## 2016-05-31 DIAGNOSIS — R05 Cough: Secondary | ICD-10-CM | POA: Diagnosis not present

## 2016-06-03 DIAGNOSIS — R0981 Nasal congestion: Secondary | ICD-10-CM | POA: Diagnosis not present

## 2016-06-03 DIAGNOSIS — J0100 Acute maxillary sinusitis, unspecified: Secondary | ICD-10-CM | POA: Diagnosis not present

## 2016-06-03 DIAGNOSIS — Z6823 Body mass index (BMI) 23.0-23.9, adult: Secondary | ICD-10-CM | POA: Diagnosis not present

## 2016-06-03 DIAGNOSIS — Z20828 Contact with and (suspected) exposure to other viral communicable diseases: Secondary | ICD-10-CM | POA: Diagnosis not present

## 2016-06-11 DIAGNOSIS — R12 Heartburn: Secondary | ICD-10-CM | POA: Diagnosis not present

## 2016-06-11 DIAGNOSIS — M5136 Other intervertebral disc degeneration, lumbar region: Secondary | ICD-10-CM | POA: Diagnosis not present

## 2016-06-11 DIAGNOSIS — I10 Essential (primary) hypertension: Secondary | ICD-10-CM | POA: Diagnosis not present

## 2016-06-11 DIAGNOSIS — E785 Hyperlipidemia, unspecified: Secondary | ICD-10-CM | POA: Diagnosis not present

## 2016-06-11 DIAGNOSIS — Z6823 Body mass index (BMI) 23.0-23.9, adult: Secondary | ICD-10-CM | POA: Diagnosis not present

## 2016-06-11 DIAGNOSIS — E538 Deficiency of other specified B group vitamins: Secondary | ICD-10-CM | POA: Diagnosis not present

## 2016-06-11 DIAGNOSIS — E559 Vitamin D deficiency, unspecified: Secondary | ICD-10-CM | POA: Diagnosis not present

## 2016-06-18 DIAGNOSIS — M5136 Other intervertebral disc degeneration, lumbar region: Secondary | ICD-10-CM | POA: Diagnosis not present

## 2016-06-18 DIAGNOSIS — M533 Sacrococcygeal disorders, not elsewhere classified: Secondary | ICD-10-CM | POA: Diagnosis not present

## 2016-06-18 DIAGNOSIS — Z79891 Long term (current) use of opiate analgesic: Secondary | ICD-10-CM | POA: Diagnosis not present

## 2016-06-18 DIAGNOSIS — M542 Cervicalgia: Secondary | ICD-10-CM | POA: Diagnosis not present

## 2016-06-24 DIAGNOSIS — K219 Gastro-esophageal reflux disease without esophagitis: Secondary | ICD-10-CM | POA: Diagnosis not present

## 2016-06-24 DIAGNOSIS — K5903 Drug induced constipation: Secondary | ICD-10-CM | POA: Diagnosis not present

## 2016-06-24 DIAGNOSIS — R110 Nausea: Secondary | ICD-10-CM | POA: Diagnosis not present

## 2016-06-24 DIAGNOSIS — Z800 Family history of malignant neoplasm of digestive organs: Secondary | ICD-10-CM | POA: Diagnosis not present

## 2016-06-24 DIAGNOSIS — Z1211 Encounter for screening for malignant neoplasm of colon: Secondary | ICD-10-CM | POA: Diagnosis not present

## 2016-06-24 DIAGNOSIS — R1313 Dysphagia, pharyngeal phase: Secondary | ICD-10-CM | POA: Diagnosis not present

## 2016-06-24 DIAGNOSIS — R1013 Epigastric pain: Secondary | ICD-10-CM | POA: Diagnosis not present

## 2016-06-25 ENCOUNTER — Other Ambulatory Visit (HOSPITAL_COMMUNITY): Payer: Self-pay | Admitting: Physician Assistant

## 2016-06-25 DIAGNOSIS — R1319 Other dysphagia: Secondary | ICD-10-CM

## 2016-07-01 ENCOUNTER — Ambulatory Visit (HOSPITAL_COMMUNITY)
Admission: RE | Admit: 2016-07-01 | Discharge: 2016-07-01 | Disposition: A | Discharge status: Home / Self Care | Payer: PPO | Source: Ambulatory Visit | Attending: Physician Assistant | Admitting: Physician Assistant

## 2016-07-01 DIAGNOSIS — R1319 Other dysphagia: Secondary | ICD-10-CM | POA: Insufficient documentation

## 2016-07-04 DIAGNOSIS — R12 Heartburn: Secondary | ICD-10-CM | POA: Diagnosis not present

## 2016-07-04 DIAGNOSIS — K2930 Chronic superficial gastritis without bleeding: Secondary | ICD-10-CM | POA: Diagnosis not present

## 2016-07-04 DIAGNOSIS — K219 Gastro-esophageal reflux disease without esophagitis: Secondary | ICD-10-CM | POA: Diagnosis not present

## 2016-07-04 DIAGNOSIS — K259 Gastric ulcer, unspecified as acute or chronic, without hemorrhage or perforation: Secondary | ICD-10-CM | POA: Diagnosis not present

## 2016-07-05 NOTE — Progress Notes (Signed)
   07/01/16 1200  SLP G-Codes **NOT FOR INPATIENT CLASS**  Functional Assessment Tool Used (clinical judgement)  Functional Limitations Swallowing  Swallow Current Status (T0354) [facility]  Swallow Goal Status (S5681) [facility]  Swallow Discharge Status (E7517) [place]  SLP Evaluations  $ SLP Speech Visit 1 Procedure  SLP Evaluations  $MBS Swallow Outpatient 1 Procedure

## 2016-07-11 DIAGNOSIS — K259 Gastric ulcer, unspecified as acute or chronic, without hemorrhage or perforation: Secondary | ICD-10-CM | POA: Diagnosis not present

## 2016-07-11 DIAGNOSIS — R12 Heartburn: Secondary | ICD-10-CM | POA: Diagnosis not present

## 2016-07-11 DIAGNOSIS — K219 Gastro-esophageal reflux disease without esophagitis: Secondary | ICD-10-CM | POA: Diagnosis not present

## 2016-07-11 DIAGNOSIS — K2930 Chronic superficial gastritis without bleeding: Secondary | ICD-10-CM | POA: Diagnosis not present

## 2016-08-30 DIAGNOSIS — M533 Sacrococcygeal disorders, not elsewhere classified: Secondary | ICD-10-CM | POA: Diagnosis not present

## 2016-08-30 DIAGNOSIS — Z79891 Long term (current) use of opiate analgesic: Secondary | ICD-10-CM | POA: Diagnosis not present

## 2016-08-30 DIAGNOSIS — G894 Chronic pain syndrome: Secondary | ICD-10-CM | POA: Diagnosis not present

## 2016-08-30 DIAGNOSIS — M542 Cervicalgia: Secondary | ICD-10-CM | POA: Diagnosis not present

## 2016-09-25 DIAGNOSIS — M533 Sacrococcygeal disorders, not elsewhere classified: Secondary | ICD-10-CM | POA: Diagnosis not present

## 2016-09-25 DIAGNOSIS — M542 Cervicalgia: Secondary | ICD-10-CM | POA: Diagnosis not present

## 2016-09-26 DIAGNOSIS — Z6822 Body mass index (BMI) 22.0-22.9, adult: Secondary | ICD-10-CM | POA: Diagnosis not present

## 2016-09-26 DIAGNOSIS — E559 Vitamin D deficiency, unspecified: Secondary | ICD-10-CM | POA: Diagnosis not present

## 2016-09-26 DIAGNOSIS — J0100 Acute maxillary sinusitis, unspecified: Secondary | ICD-10-CM | POA: Diagnosis not present

## 2016-09-26 DIAGNOSIS — F419 Anxiety disorder, unspecified: Secondary | ICD-10-CM | POA: Diagnosis not present

## 2016-09-26 DIAGNOSIS — Z1159 Encounter for screening for other viral diseases: Secondary | ICD-10-CM | POA: Diagnosis not present

## 2016-09-26 DIAGNOSIS — E785 Hyperlipidemia, unspecified: Secondary | ICD-10-CM | POA: Diagnosis not present

## 2016-09-26 DIAGNOSIS — I10 Essential (primary) hypertension: Secondary | ICD-10-CM | POA: Diagnosis not present

## 2016-09-26 DIAGNOSIS — E538 Deficiency of other specified B group vitamins: Secondary | ICD-10-CM | POA: Diagnosis not present

## 2016-09-26 NOTE — Progress Notes (Signed)
Formatting of this note is different from the original.  Assessment/Plan:      Diagnoses and all orders for this visit:    Vitamin D deficiency  -     Vitamin D 25 Hydroxy (25OH D2 + D3); Future  Monitor  Continue supplementation  Essential hypertension  -     Comprehensive Metabolic Panel; Future  Well controlled at goal below 140/90  Hyperlipidemia LDL goal <160  -     Lipid Panel; Future  Stable   Cobalamin deficiency  -     Vitamin B12 Level; Future  Monitor   Anxiety  Stable prn xanax  Encounter for hepatitis C screening test for low risk patient  -     Hepatitis C Antibody; Future    Acute non-recurrent maxillary sinusitis  rx zpack  Other orders  -     amLODIPine (NORVASC) 5 MG tablet; Take 1 tablet (5 mg total) by mouth daily.  -     atenolol (TENORMIN) 50 MG tablet; Take 1 tablet (50 mg total) by mouth daily.  -     varenicline (CHANTIX) 1 mg tablet; Take 1 tablet (1 mg total) by mouth Two (2) times a day.  -     azithromycin (ZITHROMAX) 250 MG tablet; Take 1 tablet (250 mg total) by mouth daily. Take 2 tablets the first day then one tablet daily for next 4 days        Subjective:      Patient ID: Wanda Larson is a 56 y.o. female. Pt is here for a follow up visit. She needs refills on her Amlodipine, Atenolol, and Chantix (she wants the next step up). She is currently not fasting.    HPI  Wanda Larson is here for management of chronic medical problems and needs meds refilled  She has been under some stress with her husband having some serious health problems related to alcoholism. She is safe living with daughter out of town and police are involved.  She c/o nasal congestion and facial pressure for about a week  No fever no chills    The following portions of the patient's history were reviewed and updated as appropriate:   She  has a past medical history of Acromioclavicular joint pain; Anxiety; COPD (chronic obstructive pulmonary disease) (CMS-HCC); Endometriosis; Hyperlipidemia; Hypertension; Osteopenia; and  Vitamin D deficiency.  She has Vitamin D deficiency; Osteoarthritis of spine; Osteopenia; Essential hypertension; Hyperlipidemia LDL goal <160; Degeneration of intervertebral disc of lumbar region; Cobalamin deficiency; Anxiety; History of total hysterectomy; Left shoulder pain; Bilateral low back pain without sciatica; and Heartburn on her problem list.  She  has a past surgical history that includes Hysterectomy; Breast surgery; Diagnostic laparoscopy; and Cervical biopsy w/ loop electrode excision.  Her family history includes Cancer in her mother; Cancer (age of onset: 46) in her brother; Hypertension in her mother; No Known Problems in her father.  She  reports that she has been smoking.  She has never used smokeless tobacco. She reports that she does not drink alcohol or use drugs.  Current Outpatient Prescriptions   Medication Sig Dispense Refill   ? ALPRAZolam (XANAX) 1 MG tablet Take 1 mg by mouth nightly as needed.      ? amLODIPine (NORVASC) 5 MG tablet Take 1 tablet (5 mg total) by mouth daily. 30 tablet 11   ? aspirin (ECOTRIN) 81 MG tablet Take 81 mg by mouth daily.     ? atenolol (TENORMIN) 50 MG tablet TAKE ONE TABLET BY MOUTH  DAILY 30 tablet 5   ? cholecalciferol, vitamin D3, 1,000 unit capsule Take 5,000 Units by mouth daily.      ? cyanocobalamin 1,000 mcg/mL injection Inject 1,000 mcg into the muscle once a week. b12 injection once a week for 4 weeks, then once a month.     ? flaxseed 1,000 mg cap Take by mouth.     ? fluticasone (FLONASE) 50 mcg/actuation nasal spray 1 spray by Each Nare route daily. 16 g 11   ? gabapentin (NEURONTIN) 400 MG capsule Take 1,200 mg by mouth Three (3) times a day.     ? ibuprofen (ADVIL,MOTRIN) 800 MG tablet Take 800 mg by mouth every eight (8) hours as needed.   0   ? OMEGA-3/DHA/EPA/FISH OIL (FISH OIL-OMEGA-3 FATTY ACIDS) 300-1,000 mg capsule Take 2 g by mouth daily.     ? oxyCODONE-acetaminophen (PERCOCET) 10-325 mg per tablet Take 1 tablet by mouth every eight  (8) hours as needed.   0   ? promethazine (PHENERGAN) 25 MG tablet Take 25 mg by mouth every eight (8) hours as needed.      ? varenicline (CHANTIX PAK) 0.5 mg (11)- 1 mg (42) tablet Take one 0.5mg  tab once daily for 3 days,then increase to one 0.5mg  tab twice daily for 4 days,then increase to one 1mg  tab twice daily. 1 Package 0   ? varenicline (CHANTIX) 1 mg tablet Take 1 tablet (1 mg total) by mouth Two (2) times a day. 60 tablet 2     No current facility-administered medications for this visit.      She is allergic to erythromycin; prednisone; citalopram analogues; crestor [rosuvastatin]; lipitor [atorvastatin]; and zoloft [sertraline]..    Review of Systems      Review of Systems - History obtained from the patient  General ROS: negative  Psychological ROS: negative  ENT ROS: positive for - headaches, nasal congestion and nasal discharge  Endocrine ROS: negative  Respiratory ROS: no cough, shortness of breath, or wheezing  Cardiovascular ROS: no chest pain or dyspnea on exertion  Gastrointestinal ROS: no abdominal pain, change in bowel habits, or black or bloody stools  Genito-Urinary ROS: no dysuria, trouble voiding, or hematuria  Musculoskeletal ROS: negative  Neurological ROS: no TIA or stroke symptoms  Dermatological ROS: negative    Objective:    Physical Exam      BP 107/72   Pulse 55   Ht 161.3 cm (5' 3.5")   Wt 58.7 kg (129 lb 6.4 oz)   BMI 22.56 kg/m   General appearance: alert, appears stated age and cooperative  Head: Normocephalic, without obvious abnormality, atraumatic  Eyes: conjunctivae/corneas clear. PERRL, EOM's intact. Fundi benign.  Ears: normal TM's and external ear canals both ears  Nose: nasal mucosa with edema erythema   Throat: lips, mucosa, and tongue normal; teeth and gums normal  Neck: no adenopathy, no carotid bruit, no JVD, supple, symmetrical, trachea midline and thyroid not enlarged, symmetric, no tenderness/mass/nodules  Lungs: clear to auscultation bilaterally  Heart:  regular rate and rhythm, S1, S2 normal, no murmur, click, rub or gallop  Extremities: extremities normal, atraumatic, no cyanosis or edema  Pulses: 2+ and symmetric.      Electronically signed by Nat MathVelazquez, Gretchen Yazmine, MD at 09/26/2016  2:31 PM EDT

## 2016-09-27 ENCOUNTER — Other Ambulatory Visit: Payer: Self-pay | Admitting: Internal Medicine

## 2016-09-27 DIAGNOSIS — Z1231 Encounter for screening mammogram for malignant neoplasm of breast: Secondary | ICD-10-CM

## 2016-12-23 DIAGNOSIS — E559 Vitamin D deficiency, unspecified: Secondary | ICD-10-CM | POA: Diagnosis not present

## 2016-12-23 DIAGNOSIS — I10 Essential (primary) hypertension: Secondary | ICD-10-CM | POA: Diagnosis not present

## 2016-12-23 DIAGNOSIS — F17210 Nicotine dependence, cigarettes, uncomplicated: Secondary | ICD-10-CM | POA: Diagnosis not present

## 2016-12-23 DIAGNOSIS — E538 Deficiency of other specified B group vitamins: Secondary | ICD-10-CM | POA: Diagnosis not present

## 2016-12-24 ENCOUNTER — Ambulatory Visit
Admission: RE | Admit: 2016-12-24 | Discharge: 2016-12-24 | Disposition: A | Discharge status: Home / Self Care | Payer: PPO | Source: Ambulatory Visit | Attending: Internal Medicine | Admitting: Internal Medicine

## 2016-12-24 DIAGNOSIS — M533 Sacrococcygeal disorders, not elsewhere classified: Secondary | ICD-10-CM | POA: Diagnosis not present

## 2016-12-24 DIAGNOSIS — M542 Cervicalgia: Secondary | ICD-10-CM | POA: Diagnosis not present

## 2016-12-24 DIAGNOSIS — Z1231 Encounter for screening mammogram for malignant neoplasm of breast: Secondary | ICD-10-CM

## 2016-12-24 DIAGNOSIS — M5136 Other intervertebral disc degeneration, lumbar region: Secondary | ICD-10-CM | POA: Diagnosis not present

## 2016-12-24 DIAGNOSIS — Z79891 Long term (current) use of opiate analgesic: Secondary | ICD-10-CM | POA: Diagnosis not present

## 2016-12-26 DIAGNOSIS — H2513 Age-related nuclear cataract, bilateral: Secondary | ICD-10-CM | POA: Diagnosis not present

## 2016-12-31 DIAGNOSIS — H2513 Age-related nuclear cataract, bilateral: Secondary | ICD-10-CM | POA: Diagnosis not present

## 2017-01-28 DIAGNOSIS — M5136 Other intervertebral disc degeneration, lumbar region: Secondary | ICD-10-CM | POA: Diagnosis not present

## 2017-01-28 DIAGNOSIS — M542 Cervicalgia: Secondary | ICD-10-CM | POA: Diagnosis not present

## 2017-01-28 DIAGNOSIS — B308 Other viral conjunctivitis: Secondary | ICD-10-CM | POA: Diagnosis not present

## 2017-01-29 DIAGNOSIS — E538 Deficiency of other specified B group vitamins: Secondary | ICD-10-CM | POA: Diagnosis not present

## 2017-01-29 DIAGNOSIS — H1032 Unspecified acute conjunctivitis, left eye: Secondary | ICD-10-CM | POA: Diagnosis not present

## 2017-01-30 DIAGNOSIS — Z1283 Encounter for screening for malignant neoplasm of skin: Secondary | ICD-10-CM | POA: Diagnosis not present

## 2017-01-30 DIAGNOSIS — D485 Neoplasm of uncertain behavior of skin: Secondary | ICD-10-CM | POA: Diagnosis not present

## 2017-01-30 DIAGNOSIS — D225 Melanocytic nevi of trunk: Secondary | ICD-10-CM | POA: Diagnosis not present

## 2017-02-04 DIAGNOSIS — H6123 Impacted cerumen, bilateral: Secondary | ICD-10-CM | POA: Diagnosis not present

## 2017-02-04 DIAGNOSIS — H579 Unspecified disorder of eye and adnexa: Secondary | ICD-10-CM | POA: Diagnosis not present

## 2017-02-11 DIAGNOSIS — H04123 Dry eye syndrome of bilateral lacrimal glands: Secondary | ICD-10-CM | POA: Diagnosis not present

## 2017-02-11 DIAGNOSIS — H16143 Punctate keratitis, bilateral: Secondary | ICD-10-CM | POA: Diagnosis not present

## 2017-02-20 DIAGNOSIS — H15103 Unspecified episcleritis, bilateral: Secondary | ICD-10-CM | POA: Diagnosis not present

## 2017-03-04 DIAGNOSIS — H15103 Unspecified episcleritis, bilateral: Secondary | ICD-10-CM | POA: Diagnosis not present

## 2017-10-09 IMAGING — RF DG SWALLOWING FUNCTION - NRPT [facility]
8 series · 24 of 24 positions shown · non-contrast
Comparison: none

[Series 1: cp_standard · 0.34mm/px · 3 of 380 frames shown (1 of 8)]
[frame 58/380]
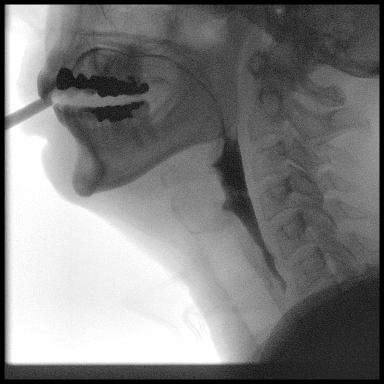
[frame 191/380]
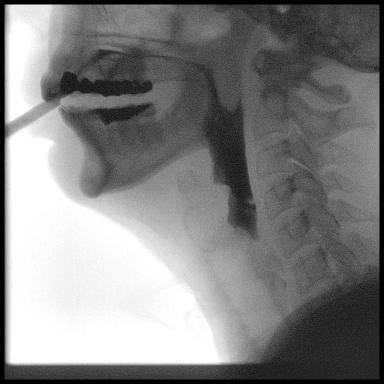
[frame 374/380]
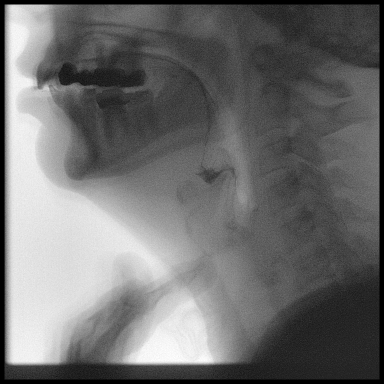

[Series 2: cp_standard · 0.34mm/px · 3 of 87 frames shown (2 of 8)]
[frame 11/87]
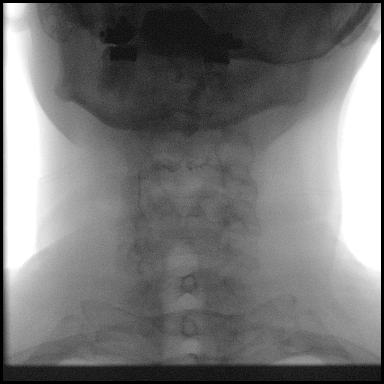
[frame 14/87]
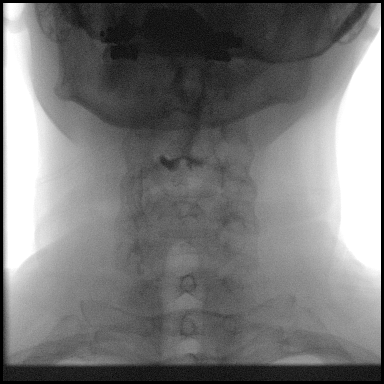
[frame 74/87]
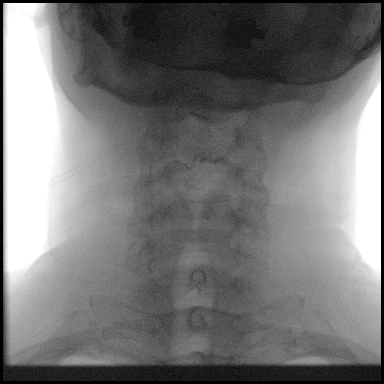

[Series 3: cp_standard · 0.34mm/px · 3 of 89 frames shown (3 of 8)]
[frame 14/89]
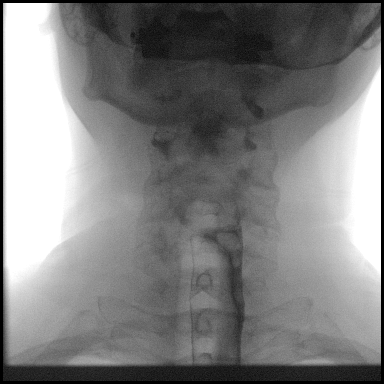
[frame 45/89]
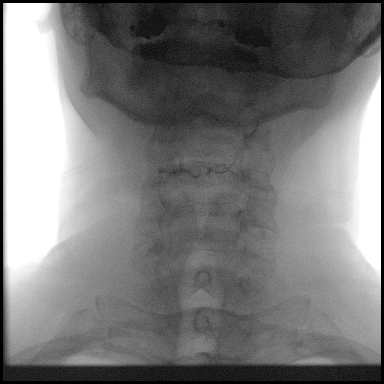
[frame 79/89]
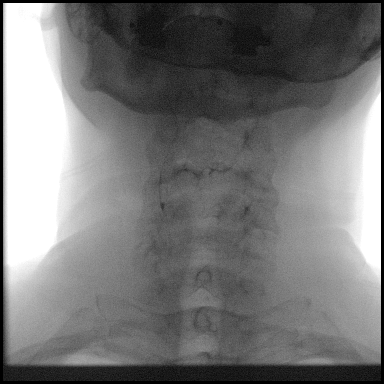

[Series 4: cp_standard · 0.34mm/px · 3 of 163 frames shown (4 of 8)]
[frame 1/163]
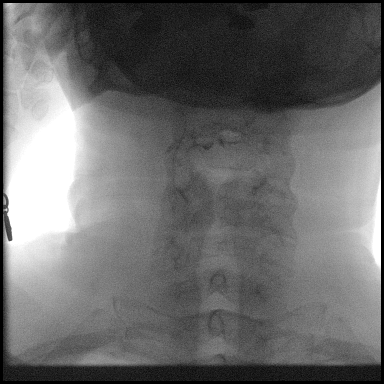
[frame 25/163]
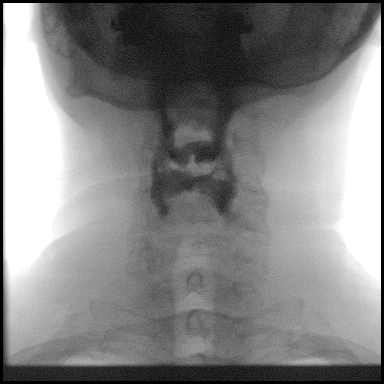
[frame 139/163]
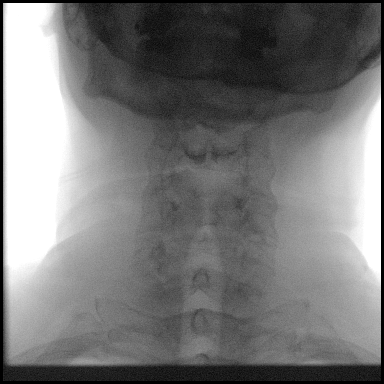

[Series 5: cp_standard · 0.34mm/px · 3 of 170 frames shown (5 of 8)]
[frame 26/170]
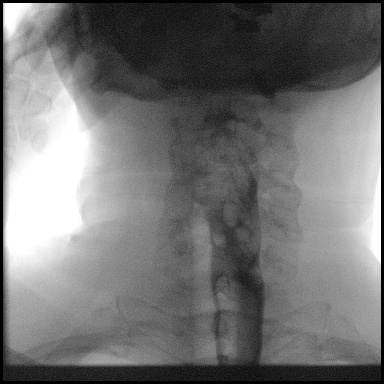
[frame 86/170]
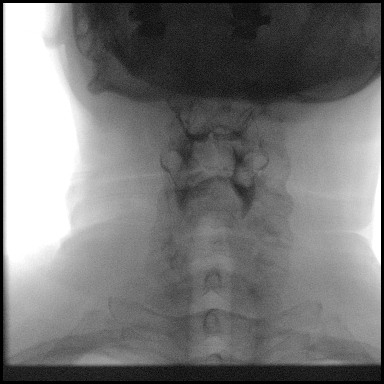
[frame 145/170]
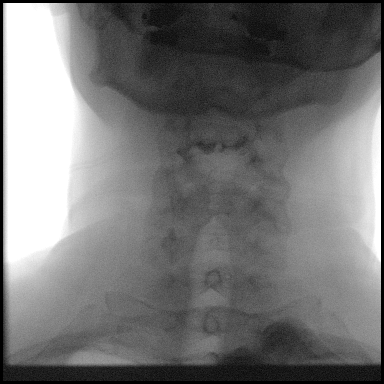

[Series 6: cp_standard · 0.35mm/px · 3 of 97 frames shown (6 of 8)]
[frame 15/97]
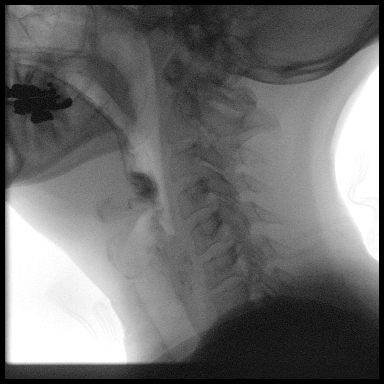
[frame 83/97]
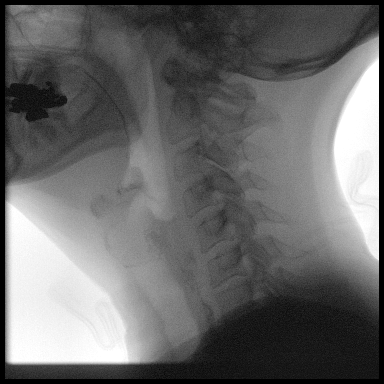
[frame 93/97]
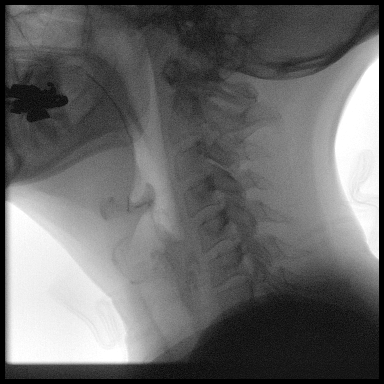

[Series 7: cp_standard · 0.35mm/px · 3 of 115 frames shown (7 of 8)]
[frame 18/115]
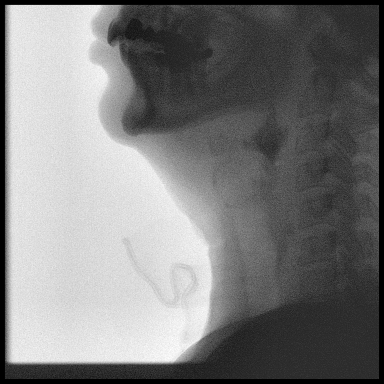
[frame 83/115]
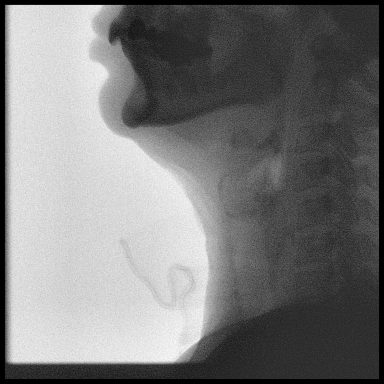
[frame 98/115]
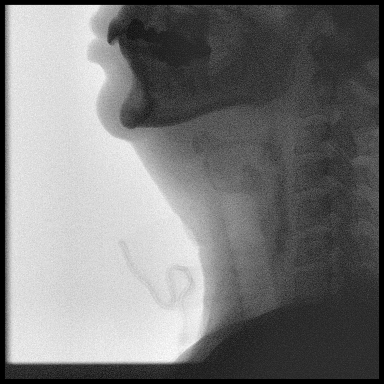

[Series 8: cp_standard · 0.34mm/px · 3 of 126 frames shown (8 of 8)]
[frame 19/126]
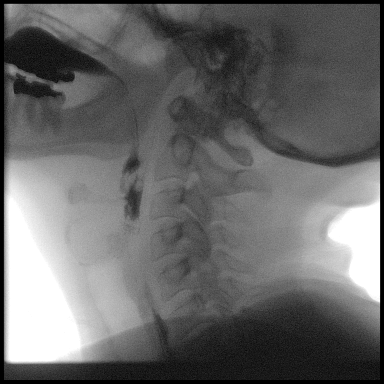
[frame 64/126]
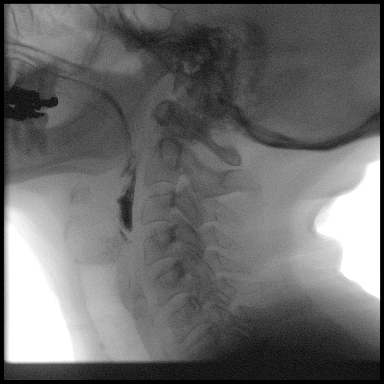
[frame 108/126]
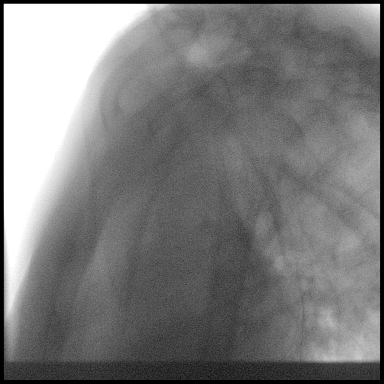

[24 of 24 positions shown; findings below may reference images not displayed]

FLUOROSCOPY FOR SWALLOWING FUNCTION STUDY:
Fluoroscopy was provided for swallowing function study, which was administered by a speech pathologist.  Final results and recommendations from this study are contained within the speech pathology report.

## 2018-01-27 NOTE — Other (Signed)
Pulmonary Function Studies       Pulmonary Function Test Entered On:  01/27/2018 11:10 EDT    Performed On:  01/27/2018 11:10 EDT by Will Bonnet, RRT, Sharyn Lull               Pulmonary Function Test   Pulmonary Function Test Performed   Baseline PFT :   Done   DLCO :   Rachel Bo, RRTSharyn Lull - 01/27/2018 11:10 EDT   Patient Participation in Treatment :   Cooperative   [place], RRT, Sharyn Lull - 01/27/2018 11:10 EDT    Signature Line     Electronically Signed on 01/27/2018 11:10 AM EDT   ________________________________________________   Will Bonnet, RRT, Sharyn Lull

## 2018-01-27 NOTE — Other (Signed)
Pulmonary Function Test    [facility] A. Clovis Riley, MD  Service Date: 01/27/2018    FINDINGS:  1.  Normal FEV1 2.58 (104% of predicted) and FVC 3.18 (104% of  predicted).  2.  Normal ratio FEV1/FVC 81%.  3.  Moderately reduced DLCO 53% with correction for alveolar volume  68%.    OVERALL INTERPRETATION:  No obstruction or restriction, but mild to  moderate reduced DLCO.      Laverda Page, MD  TR: tn DD: 01/28/2018 08:04 TD: 01/28/2018 08:16 Job#: [id number]  \\[id number]\\DOC#: [id number]  \\[id number]\\      cc:  Melven Sartorius MD  Signature Line    Electronically Signed on 01/29/2018 12:22 PM EDT  ________________________________________________  Virl Axe

## 2018-02-24 NOTE — Nursing Note (Signed)
 Adult Admission Assessment - Text       Perioperative Admission Assessment Entered On:  02/24/2018 11:15 EST    Performed On:  02/24/2018 11:13 EST by LAKE, RN, JENIFER L               General   Call Complete :   02/26/2018 13:59 EST   Height/Length Estimated :   162.6 cm(Converted to: 5 ft 4 in, 5.33 ft, 64.02 in)    Weight   Estimated :   60.9 kg(Converted to: 134 lb 4 oz, 134.262 lb)    Emergency Contact Name :   [facility]   Emergency Contact Phone :   [phone]   ALMOND RN, JENIFER L - 02/26/2018 13:31 EST   Call Start :   02/26/2018 9:24 EST   LAKE, RN, JENIFER L - 02/26/2018 9:24 EST     Information Given By :   Self, Written documentation       LAKE, RN, JENIFER L - 02/26/2018 13:31 EST         Primary Care Physician/Specialists :   DR LAVAUN   Languages :   Isadora ALMOND, RN, JENIFER L - 02/24/2018 11:13 EST   Allergies   (As Of: 03/02/2018 06:41:26 EST)   Allergies (Active)   erythromycin  Estimated Onset Date:   Unspecified ; Reactions:   HIVES ; Created By:   LAKE, RN, JENIFER L; Reaction Status:   Active ; Category:   Drug ; Substance:   erythromycin ; Type:   Allergy ; Severity:   Moderate ; Updated By:   ALMOND, RN, JENIFER L; Reviewed Date:   03/02/2018 6:39 EST        Medication History   Medication List   (As Of: 03/02/2018 06:41:26 EST)   Normal Order    Lactated Ringers Injection solution 1,000 mL  :   Lactated Ringers Injection solution 1,000 mL ; Status:   Ordered ; Ordered As Mnemonic:   Lactated Ringers Injection 1,000 mL ; Simple Display Line:   10 mL/hr, IV ; Ordering Provider:   DARRIN ALMARIE HERO; Catalog Code:   Lactated Ringers Injection ; Order Dt/Tm:   03/02/2018 06:28:19 EST ; Comment:   Perioperative use ONLY  For Non Dialysis Patient          Sodium Chloride 0.9% intravenous solution 500 mL  :   Sodium Chloride 0.9% intravenous solution 500 mL ; Status:   Ordered ; Ordered As Mnemonic:   Sodium Chloride 0.9% 500 mL ; Simple Display Line:   10 mL/hr, IV ;  Ordering Provider:   DARRIN ALMARIE HERO; Catalog Code:   Sodium Chloride 0.9% ; Order Dt/Tm:   03/02/2018 06:28:20 EST ; Comment:   Perioperative use ONLY  For Dialysis Patient          Lactated Ringers Injection solution 1,000 mL  :   Lactated Ringers Injection solution 1,000 mL ; Status:   Ordered ; Ordered As Mnemonic:   Lactated Ringers Injection 1,000 mL ; Simple Display Line:   30 mL/hr, IV ; Ordering Provider:   DARRIN ALMARIE HERO; Catalog Code:   Lactated Ringers Injection ; Order Dt/Tm:   03/02/2018 93:72:75 EST ; Comment:   for patients that DO NOT have Serum Creatinine > 2.5mg /dL, End-Stage Renal Disease, Hemodialysis          Sodium Chloride 0.9% intravenous solution 500 mL  :   Sodium Chloride 0.9% intravenous solution 500 mL ; Status:  Ordered ; Ordered As Mnemonic:   Sodium Chloride 0.9% 500 mL ; Simple Display Line:   15 mL/hr, IV ; Ordering Provider:   DARRIN ALMARIE HERO; Catalog Code:   Sodium Chloride 0.9% ; Order Dt/Tm:   03/02/2018 93:72:74 EST ; Comment:   For Patients with Serum Creatinine > 2.5mg /dL, End-Stage Renal Disease, Hemodialysis          sodium chloride 0.9% Inj Soln 10 mL syringe  :   sodium chloride 0.9% Inj Soln 10 mL syringe ; Status:   Ordered ; Ordered As Mnemonic:   sodium chloride 0.9% flush syringe range dose ; Simple Display Line:   30 mL, IV Push, q8hr ; Ordering Provider:   DARRIN ALMARIE HERO; Catalog Code:   sodium chloride flush ; Order Dt/Tm:   03/02/2018 06:29:00 EST          A Patient Specific Medication  :   A Patient Specific Medication ; Status:   Ordered ; Ordered As Mnemonic:   A Patient Specific Medication ; Simple Display Line:   1 EA, Kit-Combo, q17min, PRN: other (see comment) ; Ordering Provider:   DARRIN ALMARIE HERO; Catalog Code:   A Patient Specific Medication ; Order Dt/Tm:   03/02/2018 06:29:00 EST          A Patient Specific Refrigerated Medication  :   A Patient Specific Refrigerated Medication ; Status:   Ordered ; Ordered As  Mnemonic:   A Patient Specific Refrigerated Medication ; Simple Display Line:   1 EA, Kit-Combo, q3min, PRN: other (see comment) ; Ordering Provider:   DARRIN ALMARIE HERO; Catalog Code:   A Patient Specific Refrigerated Medicati ; Order Dt/Tm:   03/02/2018 06:29:00 EST ; Comment:   to access the patient specific Refrigerated medications          acetaminophen  500 mg Tab  :   acetaminophen  500 mg Tab ; Status:   Ordered ; Ordered As Mnemonic:   Tylenol  ; Simple Display Line:   1,000 mg, 2 tabs, Oral, On Call ; Ordering Provider:   DARRIN ALMARIE HERO; Catalog Code:   acetaminophen  ; Order Dt/Tm:   03/02/2018 06:28:20 EST          ceFAZolin duplex  :   ceFAZolin duplex ; Status:   Ordered ; Ordered As Mnemonic:   ceFAZolin IVPB ; Simple Display Line:   2 g, 50 mL, 100 mL/hr, IV Piggyback, On Call ; Ordering Provider:   DARRIN ALMARIE HERO; Catalog Code:   ceFAZolin ; Order Dt/Tm:   03/02/2018 06:28:20 EST ; Comment:   wt < 120Kg/264lb          Delivery and Return Bin Access  :   Delivery and Return [employer] ; Status:   Ordered ; Ordered As Mnemonic:   Delivery and Return [employer] ; Simple Display Line:   1 EA, Kit-Combo, q5min, PRN: other (see comment) ; Ordering Provider:   DARRIN ALMARIE HERO; Catalog Code:   Delivery and Return Bin Access ; Order Dt/Tm:   03/02/2018 06:29:01 EST ; Comment:   This code grants access to the [employer] for the Delivery and Return Bin Access          gabapentin 300 mg Cap  :   gabapentin 300 mg Cap ; Status:   Ordered ; Ordered As Mnemonic:   Neurontin ; Simple Display Line:   300 mg, 1 caps, Oral, On Call ; Ordering Provider:  DARRIN NORRIS M; Catalog Code:   gabapentin ; Order Dt/Tm:   03/02/2018 06:28:20 EST          lidocaine 1% PF Inj Soln 2 mL  :   lidocaine 1% PF Inj Soln 2 mL ; Status:   Ordered ; Ordered As Mnemonic:   lidocaine 1% preservative-free injectable solution ; Simple Display Line:   0.25 mL, ID, q5min, PRN: other (see  comment) ; Ordering Provider:   DARRIN NORRIS HERO; Catalog Code:   lidocaine ; Order Dt/Tm:   03/02/2018 06:29:00 EST ; Comment:   to access lidocaine 1%  2 mL vial for IV start and Life Port access          lidocaine 2% Topical Gel with applicator 10-11 mL  :   lidocaine 2% Topical Gel with applicator 10-11 mL ; Status:   Ordered ; Ordered As Mnemonic:   Uro-Jet 2% topical gel with applicator ; Simple Display Line:   1 app, Topical, q5min, PRN: other (see comment) ; Ordering Provider:   DARRIN NORRIS HERO; Catalog Code:   lidocaine topical ; Order Dt/Tm:   03/02/2018 06:29:01 EST          Respiratory MDI Treatment  :   Respiratory MDI Treatment ; Status:   Ordered ; Ordered As Mnemonic:   Respiratory MDI Treatment ; Simple Display Line:   1 EA, Kit-Combo, q5min, PRN: other (see comment) ; Ordering Provider:   DARRIN NORRIS HERO; Catalog Code:   Respiratory MDI Treatment ; Order Dt/Tm:   03/02/2018 06:29:01 EST          sodium chloride 0.9% Inj Soln 10 mL syringe  :   sodium chloride 0.9% Inj Soln 10 mL syringe ; Status:   Ordered ; Ordered As Mnemonic:   sodium chloride 0.9% flush syringe range dose ; Simple Display Line:   30 mL, IV Push, q5min, PRN: other (see comment) ; Ordering Provider:   DARRIN NORRIS HERO; Catalog Code:   sodium chloride flush ; Order Dt/Tm:   03/02/2018 06:28:50 EST          sodium chloride 0.9% Inj Soln 10 mL vial PF  :   sodium chloride 0.9% Inj Soln 10 mL vial PF ; Status:   Ordered ; Ordered As Mnemonic:   sodium chloride 0.9% vial for reconstitution range dose ; Simple Display Line:   30 mL, IV Push, q5min, PRN: other (see comment) ; Ordering Provider:   DARRIN NORRIS HERO; Catalog Code:   sodium chloride flush ; Order Dt/Tm:   03/02/2018 06:29:00 EST ; Comment:   for access to sodium chloride 0.9% vial when needed as a diluent for reconstitutable medications          sterile water Inj Soln 10 mL  :   sterile water Inj Soln 10 mL ; Status:   Ordered ; Ordered As  Mnemonic:   sterile water for reconstitution ; Simple Display Line:   10 mL, N/A, q5min, PRN: other (see comment) ; Ordering Provider:   DARRIN NORRIS HERO; Catalog Code:   sterile water for reconstitution ; Order Dt/Tm:   03/02/2018 06:29:00 EST ; Comment:   Access sterile water when needed as a diluent for reconstitutable medications. Not for IV use.            Home Meds    amLODIPine  :   amLODIPine ; Status:   Documented ; Ordered As Mnemonic:   amLODIPine 5 mg oral  tablet ; Simple Display Line:   5 mg, 1 tabs, Oral, qPM, 0 Refill(s) ; Catalog Code:   amLODIPine ; Order Dt/Tm:   02/26/2018 13:57:36 EST          atenolol  :   atenolol ; Status:   Documented ; Ordered As Mnemonic:   atenolol 50 mg oral tablet ; Simple Display Line:   50 mg, 1 tabs, Oral, qPM, 30 tabs, 0 Refill(s) ; Catalog Code:   atenolol ; Order Dt/Tm:   02/26/2018 13:57:36 EST          ergocalciferol  :   ergocalciferol ; Status:   Documented ; Ordered As Mnemonic:   Vitamin D 1000 intl units oral tablet ; Simple Display Line:   3 tabs, Oral, Daily, 0 Refill(s) ; Catalog Code:   ergocalciferol ; Order Dt/Tm:   02/26/2018 13:57:13 EST          gabapentin  :   gabapentin ; Status:   Documented ; Ordered As Mnemonic:   gabapentin 400 mg oral capsule ; Simple Display Line:   800 mg, Oral, qAM, 0 Refill(s) ; Catalog Code:   gabapentin ; Order Dt/Tm:   02/26/2018 13:57:36 EST          gabapentin  :   gabapentin ; Status:   Documented ; Ordered As Mnemonic:   gabapentin 400 mg oral capsule ; Simple Display Line:   1,200 mg, Oral, qPM, 0 Refill(s) ; Catalog Code:   gabapentin ; Order Dt/Tm:   02/26/2018 13:57:36 EST          omeprazole  :   omeprazole ; Status:   Documented ; Ordered As Mnemonic:   omeprazole 40 mg oral delayed release capsule ; Simple Display Line:   40 mg, 1 caps, Oral, Daily, 0 Refill(s) ; Catalog Code:   omeprazole ; Order Dt/Tm:   02/26/2018 13:57:36 EST          varenicline  :   varenicline ; Status:   Documented ; Ordered As  Mnemonic:   Chantix Continuing Month 1 mg oral tablet ; Simple Display Line:   1 mg, 1 tabs, Oral, BID, 0 Refill(s) ; Ordering Provider:   BONNI LEITA CROME; Catalog Code:   varenicline ; Order Dt/Tm:   02/26/2018 13:57:36 EST          acetaminophen -oxyCODONE  :   acetaminophen -oxyCODONE ; Status:   Documented ; Ordered As Mnemonic:   oxyCODONE-acetaminophen  10 mg-325 mg oral tablet range dose ; Simple Display Line:   0.5 tabs, Oral, TID, PRN: moderate pain (4-7), 0 Refill(s) ; Catalog Code:   acetaminophen -oxyCODONE ; Order Dt/Tm:   02/26/2018 13:42:26 EST ; Comment:   MAX DAILY DOSE OF ACETAMINOPHEN  = 4000 MG            Problem History   (As Of: 03/02/2018 06:41:27 EST)   Problems(Active)    Chronic back pain (SNOMED CT  :783786981 )  Name of Problem:   Chronic back pain ; Recorder:   LAKE, RN, JENIFER L; Confirmation:   Confirmed ; Classification:   Patient Stated ; Code:   783786981 ; Contributor System:   PowerChart ; Last Updated:   02/26/2018 13:43 EST ; Life Cycle Date:   02/26/2018 ; Life Cycle Status:   Active ; Vocabulary:   SNOMED CT        GERD (gastroesophageal reflux disease) (SNOMED CT  :646864985 )  Name of Problem:   GERD (gastroesophageal reflux disease) ; Recorder:  LAKE, RN, JENIFER L; Confirmation:   Confirmed ; Classification:   Patient Stated ; Code:   646864985 ; Contributor System:   PowerChart ; Last Updated:   02/24/2018 11:11 EST ; Life Cycle Date:   02/24/2018 ; Life Cycle Status:   Active ; Vocabulary:   SNOMED CT        HTN (hypertension) (SNOMED CT  :8784255987 )  Name of Problem:   HTN (hypertension) ; Recorder:   LAKE, RN, JENIFER L; Confirmation:   Confirmed ; Classification:   Patient Stated ; Code:   [id number] ; Contributor System:   PowerChart ; Last Updated:   02/24/2018 11:11 EST ; Life Cycle Date:   02/24/2018 ; Life Cycle Status:   Active ; Vocabulary:   SNOMED CT        Tooth pain (SNOMED CT  :54266981 )  Name of Problem:   Tooth pain ; Recorder:   LAKE, RN, JENIFER L;  Confirmation:   Confirmed ; Classification:   Patient Stated ; Code:   [id number] ; Contributor System:   PowerChart ; Last Updated:   02/26/2018 13:46 EST ; Life Cycle Status:   Active ; Vocabulary:   SNOMED CT   ; Comments:        02/26/2018 13:46 - LAKE, RN, JENIFER L  BACK LEFT UPPER- DR BEVERLI AWARE- ON AB=NTIBIOTICS FOR 7 DAYS        Diagnoses(Active)    Lung cancer  Date:   02/26/2018 ; Confirmation:   Confirmed ; Clinical Dx:   Lung cancer ; Classification:   Medical ; Clinical Service:   Non-Specified ; Code:   ICD-10-CM ; Probability:   0 ; Diagnosis Code:   C34.90        Procedure History        -    Procedure History   (As Of: 03/02/2018 06:41:27 EST)     Anesthesia Minutes:   0 ; Procedure Name:   Colonoscopy ; Procedure Minutes:   0 ; Last Reviewed Dt/Tm:   03/02/2018 06:40:59 EST            Anesthesia Minutes:   0 ; Procedure Name:   Hysterectomy ; Procedure Minutes:   0 ; Last Reviewed Dt/Tm:   03/02/2018 06:40:59 EST            Procedure Dt/Tm:   7999 ; Anesthesia Minutes:   0 ; Procedure Name:   ORIF Right Ankle Fracture with Hardware ; Procedure Minutes:   0 ; Last Reviewed Dt/Tm:   03/02/2018 06:40:59 EST            History Confirmation   Problem History Changes PAT :   No   Procedure History Changes PAT :   No   SUGGS, RN, JONI B - 03/02/2018 6:39 EST   Anesthesia/Sedation   Anesthesia History :   Prior general anesthesia   SN - Malignant Hyperthermia :   Denies   Previous Problem with Anesthesia :   None   Opioid Exposure :   Opioid Tolerant   Moderate Sedation History :   Prior sedation for procedure   Previous Problem With Sedation :   Other:  HARD TO SEDATE   Symptoms of Sleep Apnea :   No OSA Symptoms   Shortness of Breath Indicator :   No shortness of breath   Pregnancy Status :   Patient denies   LAKE, RN, JENIFER L - 02/26/2018 13:31 EST   Bloodless Medicine   Will Patient Accept  Blood Transfusion and/or Blood Products :   Yes   LAKE, RN, JENIFER L - 02/26/2018 13:31 EST   ID Risk Screen  Symptoms   Recent Travel History :   No recent travel   TB Symptom Screen :   No symptoms   C. diff Symptom/History ID :   Neither of the above   LAKE, RN, JENIFER L - 02/26/2018 13:31 EST   Social History   Social History   (As Of: 03/02/2018 06:41:27 EST)   Tobacco:        Tobacco use: 4 or less cigarettes(less than 1/4 pack)/day in last 30 days.  Cigarettes, 2 per day.   (Last Updated: 02/26/2018 13:47:13 EST by LAKE, RN, JENIFER L)          Alcohol:        Denies   (Last Updated: 02/26/2018 13:47:36 EST by LAKE, RN, JENIFER L)          Substance Abuse:        Denies   (Last Updated: 02/26/2018 13:47:39 EST by LAKE, RN, JENIFER L)            Advance Directive   Advance Directive :   No   LAKE, RN, JENIFER L - 02/26/2018 13:31 EST   PAT Patient Instructions   Patient Arrival Time PAT :   03/02/2018 6:00 EST   Medications in AM :   GABAPENTIN, CHANTIX, OMEPRAZOLE   Medication Understanding :   Verbalizes understanding   NPO PAT :   NPO after midnight   LAKE, RN, JENIFER L - 02/26/2018 13:31 EST   PAT Instructions Grid   Make up Understanding :   Verbalizes understanding   Jewelry Understanding :   Verbalizes understanding   Perfume Understanding :   Verbalizes understanding   Valuables Understanding :   Verbalizes understanding   Clothing Understanding :   Verbalizes understanding   Contact MD for Illness :   Verbalizes understanding   Contact MD for skin injury :   Verbalizes understanding   LAKE, RN, JENIFER L - 02/26/2018 13:31 EST   Service Line PAT :   Cardio/Thoracic   Laterality PAT :   Left   Prep PAT :   Hibiclens   Name of Contact PAT :   STEPHANIE   Relationship of Contact PAT :   DAUGHTER   Contact Number PAT :   [phone]   Transportation Instructions PAT :   Accompany to Hospital, Transport Home, Remain with 24 hours post-procedure   LAKE, RN, JENIFER L - 02/26/2018 13:31 EST   Harm Screen   Injuries/Abuse/Neglect in Household :   Denies   Feels Unsafe at Home :   No   SUGGS, RN, JONI B -  03/02/2018 6:39 EST

## 2018-02-26 LAB — URINALYSIS WITH MICROSCOPIC
Blood, Urine: NEGATIVE
Glucose, UA: NEGATIVE mg/dL
Ketones, Urine: NEGATIVE mg/dL
Leukocyte Esterase, Urine: NEGATIVE
Nitrite, Urine: NEGATIVE
Protein, UA: NEGATIVE
Specific Gravity, UA: 1.025 (ref 1.003–1.035)
Urobilinogen, Urine: 1 EU/dL
pH, UA: 6 (ref 4.5–8.0)

## 2018-02-26 LAB — BASIC METABOLIC PANEL
Anion Gap: 10 mmol/L (ref 2–17)
BUN: 23 mg/dL — ABNORMAL HIGH (ref 6–20)
CO2: 26 mmol/L (ref 22–29)
Calcium: 9 mg/dL (ref 8.6–10.0)
Chloride: 105 mmol/L (ref 98–107)
Creatinine: 0.5 mg/dL (ref 0.5–0.9)
GFR African American: 123 mL/min/{1.73_m2} (ref >=90)
GFR Non-African American: 106 mL/min/{1.73_m2} (ref >=90)
Glucose: 104 mg/dL — ABNORMAL HIGH (ref 70–99)
Osmolaliy Calculated: 285 mosm/kg (ref 270–287)
Potassium: 3.7 mmol/L (ref 3.5–5.3)
Sodium: 141 mmol/L (ref 135–145)

## 2018-02-26 LAB — CBC WITH AUTO DIFFERENTIAL
Basophils %: 0.8 % (ref 0.0–2.0)
Basophils Absolute: 0.1 10*3/uL (ref 0.0–0.2)
Eosinophils %: 1.7 % (ref 0.0–7.0)
Eosinophils Absolute: 0.2 10*3/uL (ref 0.0–0.5)
Hematocrit: 39 % (ref 34.0–47.0)
Hemoglobin: 13.1 g/dL (ref 11.5–15.7)
Lymphocytes Absolute: 4.8 10*3/uL — ABNORMAL HIGH (ref 1.0–3.2)
Lymphocytes: 48.1 % — ABNORMAL HIGH (ref 15.0–45.0)
MCH: 30.7 pg (ref 27.0–34.5)
MCHC: 33.5 g/dL (ref 32.0–36.0)
MCV: 91.7 fL (ref 81.0–99.0)
MPV: 9.5 fL (ref 7.2–13.2)
Monocytes %: 6.9 % (ref 4.0–12.0)
Monocytes Absolute: 0.7 10*3/uL (ref 0.3–1.0)
Neutrophils %: 42.5 % (ref 42.0–74.0)
Neutrophils Absolute: 4.2 10*3/uL (ref 1.6–7.3)
Platelets: 248 10*3/uL (ref 140–440)
RBC: 4.26 x10e6/mcL (ref 3.60–5.20)
RDW: 13.5 % (ref 11.0–16.0)
WBC: 9.9 10*3/uL (ref 3.8–10.6)

## 2018-02-26 LAB — PROTIME-INR
INR: 0.9 — ABNORMAL LOW (ref 1.5–3.5)
Protime: 12.3 seconds — AB (ref 11.6–14.5)

## 2018-02-26 LAB — PTT: APTT: 25.1 s (ref 23.3–34.5)

## 2018-02-26 LAB — ALBUMIN: Albumin: 4.5 g/dL (ref 3.5–5.2)

## 2018-02-26 LAB — DIFFERENTIAL, MANUAL: Platelet Estimate: ADEQUATE

## 2018-02-27 LAB — HEMOGLOBIN A1C
Est. Avg. Glucose, WB: 120
Est. Avg. Glucose-calculated: 129
Hemoglobin A1C: 5.8 % (ref 4.0–6.0)

## 2018-02-27 NOTE — Nursing Note (Signed)
Adult Admission Assessment - Text       Perioperative Admission Assessment Entered On:  02/27/2018 18:22 EST    Performed On:  02/27/2018 18:21 EST by Carin HockSUGGS, RN, JONI B               General   Call Start :   02/26/2018 9:24 EST   Call Complete :   02/26/2018 13:59 EST   Information Given By :   Self, Written documentation   Height/Length Estimated :   162.6 cm(Converted to: 5 ft 4 in, 5.33 ft, 64.02 in)    Weight   Estimated :   60.9 kg(Converted to: 134 lb 4 oz, 134.262 lb)    Primary Care Physician/Specialists :   [facility]   Emergency Contact Name :   Dory LarsenSTEPHANIE JONES-DAUGHTER   Emergency Contact Phone :   [phone]   Languages :   Dot BeenEnglish   SUGGS, RN, JONI B - 02/27/2018 18:21 EST   Procedure History        -    Procedure History   (As Of: 02/27/2018 18:22:36 EST)     Anesthesia Minutes:   0 ; Procedure Name:   Colonoscopy ; Procedure Minutes:   0 ; Last Reviewed Dt/Tm:   02/27/2018 18:22:34 EST            Anesthesia Minutes:   0 ; Procedure Name:   Hysterectomy ; Procedure Minutes:   0 ; Last Reviewed Dt/Tm:   02/27/2018 18:22:34 EST            Anesthesia Minutes:   0 ; Procedure Name:   ORIF Right Ankle Fracture with Hardware ; Procedure Minutes:   0 ; Last Reviewed Dt/Tm:   02/27/2018 18:22:34 EST

## 2018-03-02 LAB — ANTIBODY SCREEN: Antibody Screen: NEGATIVE

## 2018-03-02 LAB — ABO/RH: ABO/Rh: A POS

## 2018-03-02 NOTE — Nursing Note (Signed)
Medication Administration Follow Up-Text       Medication Administration Follow Up Entered On:  03/02/2018 12:23 EST    Performed On:  03/02/2018 11:37 EST by Tollie PizzaFreitas, RN, Donalda EwingsStevie C      Intervention Information:     hydromorphone  Performed by Tollie PizzaFreitas, RN, Stevie C on 03/02/2018 11:22:00 EST       hydromorphone,0.5mg   IV Push,Arm, Upper Right,other (see comment)       Med Response   ED Medication Response :   Symptoms improved   Dorothey BasemanFreitas, RN, Stevie C - 03/02/2018 12:23 EST

## 2018-03-02 NOTE — Nursing Note (Signed)
Medication Administration Follow Up-Text       Medication Administration Follow Up Entered On:  03/02/2018 17:21 EST    Performed On:  03/02/2018 16:47 EST by HAZEL, RN, [place] O      Intervention Information:     ketorolac  Performed by HAZEL, RN, [place] O on 03/02/2018 16:32:00 EST       ketorolac,15mg   IV Push,Wrist, Right       Med Response   ED Medication Response :   No adverse reaction, Symptoms improved   Numeric Rating Pain Scale :   10 = Worst possible pain   [place] Opioid Induced Sedation Scale :   1 = Awake and alert   Respiratory Rate :   18 br/min   Pain Description Section :   Document assessment   HAZEL, RN, [place] O - 03/02/2018 17:20 EST   Pain Description   Laterality :   Left   Pain Location :   Chest   Quality :   Discomfort   HAZEL, RN, [place] O - 03/02/2018 17:20 EST

## 2018-03-02 NOTE — Op Note (Signed)
Phase I Record - [facility]             Phase I Record - [facility] Summary                                                                   Primary Physician:        Hale Drone    Case Number:              [id number]    Finalized Date/Time:      03/02/18 13:03:45    Pt. Name:                 Wanda Larson, Wanda Larson    D.O.B./Sex:               [birth year]    Female    Med Rec #:                [id number]    Physician:                Hale Drone    Financial #:              [id number]    Pt. Type:                 I    Room/Bed:                 /    Admit/Disch:              03/02/18 06:28:00 -    Institution:       [facility] Case Attendance - Phase I                                                                                            Entry 1                         Entry 2                         Entry 3                                          Case Attendee             KLINE-MD,  Selmer Dominion, RN, Tana Conch, RN, AMY B    Role Performed            Surgeon Primary                 Post Anesthesia Care  Post Anesthesia Care                                                              Nurse                           Nurse    Time In     Time Out     Last Modified By:         Tollie Pizza, RN, Alric Ran, RN, Alric Ran, RN, Donalda Ewings                              03/02/18 13:03:29               03/02/18 13:03:29               03/02/18 13:03:29      [facility] - Case Times - Phase I                                                                                               Entry 1                                                                                                          Phase I In                03/02/18 11:07:00               Phase I Out                     03/02/18 12:27:00    Phase I Discharge         03/02/18 12:27:00    Time     Last Modified By:         Tollie Pizza, RN, Stevie C                              03/02/18 13:03:43               Finalized By: Tollie Pizza, RN, Donalda Ewings      Document Signatures  Signed By:           Tollie PizzaFreitas, RN, Stevie C 03/02/18 13:03

## 2018-03-02 NOTE — Nursing Note (Signed)
Medication Administration Follow Up-Text       Medication Administration Follow Up Entered On:  03/02/2018 12:23 EST    Performed On:  03/02/2018 11:48 EST by Tollie PizzaFreitas, RN, Donalda EwingsStevie C      Intervention Information:     hydromorphone  Performed by Tollie PizzaFreitas, RN, Stevie C on 03/02/2018 11:33:00 EST       hydromorphone,0.5mg   IV Push,Forearm, Upper Right,other (see comment)       Med Response   ED Medication Response :   Symptoms improved   Dorothey BasemanFreitas, RN, Stevie C - 03/02/2018 12:23 EST

## 2018-03-02 NOTE — Nursing Note (Signed)
Medication Administration Follow Up-Text       Medication Administration Follow Up Entered On:  03/02/2018 19:08 EST    Performed On:  03/02/2018 19:08 EST by HAZEL, RN, [place] O      Intervention Information:     morphine  Performed by HAZEL, RN, [place] O on 03/02/2018 18:44:00 EST       morphine,2mg   IV Push,Forearm, Mid Right,severe pain (8-10)       Med Response   ED Medication Response :   No adverse reaction, Symptoms improved   Numeric Rating Pain Scale :   8   [place] Opioid Induced Sedation Scale :   1 = Awake and alert   Respiratory Rate :   18 br/min   Pain Description Section :   Document assessment   HAZEL, RN, [place] O - 03/02/2018 19:08 EST   Pain Description   Laterality :   Left   Pain Location :   Chest   Quality :   Aching, Discomfort   HAZEL, RN, [place] O - 03/02/2018 19:08 EST

## 2018-03-02 NOTE — Anesthesia Pre-Procedure Evaluation (Signed)
Preanesthesia Evaluation        Patient:   Wanda Larson, Wanda Larson            MRN: [id number]            FIN: [id number]               Age:   57 years     Sex:  Female     DOB:  [birth year]   Associated Diagnoses:   None   Author:   Shealynn Saulnier-MD,  Laberta Wilbon J      Preoperative Information   Time of Last Intake   NPO:  NPO greater than 8 hours.    Anesthesia history     Patient's history: negative.        Review of Systems   Respiratory:  Negative.    Cardiovascular:  Negative.    Gastrointestinal:  Negative.    Endocrine:  Negative.    Neurologic:  Negative.       Health Status   Allergies:    Allergic Reactions (Selected)  Moderate  Erythromycin- Hives.   Current medications:    Home Medications (8) Active  amLODIPine 5 mg oral tablet 5 mg = 1 tabs, Oral, qPM  atenolol 50 mg oral tablet 50 mg = 1 tabs, Oral, qPM  Chantix Continuing Month 1 mg oral tablet 1 mg = 1 tabs, Oral, BID  gabapentin 400 mg oral capsule 800 mg, Oral, qAM  gabapentin 400 mg oral capsule 1,200 mg, Oral, qPM  omeprazole 40 mg oral delayed release capsule 40 mg = 1 caps, Oral, Daily  oxyCODONE-acetaminophen 10 mg-325 mg oral tablet range dose 0.5 tabs, PRN, Oral, TID  Vitamin D 1000 intl units oral tablet 3 tabs, Oral, Daily,    Medications (16) Active  Scheduled: (3)  ceFAZolin duplex  2 g 50 mL, IV Piggyback, On Call  gabapentin 300 mg Cap  300 mg 1 caps, Oral, On Call  sodium chloride 0.9% Inj Soln 10 mL syringe  30 mL, IV Push, q8hr  Continuous: (4)  Lactated Ringers Injection solution 1,000 mL  1,000 mL, IV, 10 mL/hr  Lactated Ringers Injection solution 1,000 mL  1,000 mL, IV, 30 mL/hr  Sodium Chloride 0.9% intravenous solution 500 mL  500 mL, IV, 10 mL/hr  Sodium Chloride 0.9% intravenous solution 500 mL  500 mL, IV, 15 mL/hr  PRN: (9)  A Patient Specific Medication  1 EA, Kit-Combo, q55mn  A Patient Specific Refrigerated Medication  1 EA, Kit-Combo, q596m  Delivery and Return Bin Access  1 EA, Kit-Combo, q5m66m lidocaine 1% PF Inj Soln 2 mL  0.25 mL, ID,  q5mi61mlidocaine 2% Topical Gel with applicator 10-176-28 1 app, Topical, q5min88mespiratory MDI Treatment  1 EA, Kit-Combo, q5min 92mdium chloride 0.9% Inj Soln 10 mL syringe  30 mL, IV Push, q5min  33mium chloride 0.9% Inj Soln 10 mL vial PF  30 mL, IV Push, q5min  s43mile water Inj Soln 10 mL  10 mL, N/A, q5min   P78mlem list:    Active Problems (4)  Chronic back pain   GERD (gastroesophageal reflux disease)   HTN (hypertension)   Tooth pain ,    Problems   (Active Problems Only)    Toothache   (SNOMED CT: 45733018,3151761612/31/00)   Comment:  BACK LEFT UPPER- DR KLINE AWAJens SomN AB=NTIBIOTICS FOR 7 DAYS  ([place], RN, JENIFER L  on 02/26/18)   Hypertensive disorder   (SNOMED CT: 1017510258, Onset: --)  Gastroesophageal reflux disease   (SNOMED CT: 527782423, Onset: --)  Chronic back pain   (SNOMED CT: 536144315, Onset: --)        Histories   Past Medical History:    No active or resolved past medical history items have been selected or recorded.   Procedure history:    ORIF Right Ankle Fracture with Hardware in 2000 at 38 Years.  Hysterectomy (400867619).  Colonoscopy (509326712).   Social History        Social & Psychosocial Habits    Alcohol  02/26/2018  Use: Denies    Substance Abuse  02/26/2018  Use: Denies    Tobacco  02/26/2018  Use: 4 or less cigarettes(less    Type: Cigarettes    Tobacco use per day: 2.        Physical Examination   Vital Signs   45/80/9983 3:82 EST Systolic Blood Pressure 505 mmHg    Diastolic Blood Pressure 60 mmHg    Temperature Oral 36.5 degC    Heart Rate Monitored 46 bpm  <LLOW    Respiratory Rate 16 br/min    SpO2 95 %    SBP/DBP Cuff Details Right arm         Vital Signs (last 24 hrs)_____  Last Charted___________  Temp Oral     36.5 degC  (NOV 18 06:41)  Resp Rate         16 br/min  (NOV 18 06:41)  SBP      122 mmHg  (NOV 18 06:41)  DBP      60 mmHg  (NOV 18 06:41)  SpO2      95 %  (NOV 18 06:41)  Weight      60.4 kg  (NOV 18 06:41)  Height      162.6 cm  (NOV  18 06:41)   Measurements from flowsheet : Measurements   03/02/2018 6:41 EST Height/Length Measured 162.6 cm    Weight Measured 60.4 kg    Weight Dosing 60.4 kg    Body Mass Index est meas 22.85      Pain assessment:  Pain Assessment   03/02/2018 6:41 EST Numeric Rating Pain Scale 0 = No pain    Numeric Pain Rating Comfort Function Goal 4      .    General:          Stress: No acute distress.         Appearance: Within normal limits.       Review / Management   Results review:     No qualifying data available, Lab results: 03/02/2018 6:42 EST      Estimated Creatinine Clearance            104.68 mL/min  .       Assessment and Plan   American Society of Anesthesiologists#(ASA) physical status classification:  Class III.    Anesthetic Preoperative Plan     Anesthetic technique: General anesthesia, Inhalation.     Maintenance airway: Oral endotracheal tube.     Opioid Assessment: Opioid Na????ve.     Signature Line     Electronically Signed on 03/02/2018 06:58 AM EST   ________________________________________________   Kadynce Bonds-MD,  Carola Rhine

## 2018-03-02 NOTE — Nursing Note (Signed)
Medication Administration Follow Up-Text       Medication Administration Follow Up Entered On:  03/02/2018 18:35 EST    Performed On:  03/02/2018 18:20 EST by HAZEL, RN, [place] O      Intervention Information:     hydromorphone  Performed by HAZEL, RN, [place] O on 03/02/2018 17:20:00 EST       hydromorphone,2mg   Oral,moderate pain (4-7)       Med Response   ED Medication Response :   No adverse reaction, No change in symptoms   Numeric Rating Pain Scale :   10 = Worst possible pain   Pasero Opioid Induced Sedation Scale :   1 = Awake and alert   Respiratory Rate :   20 br/min   Pain Description Section :   Document assessment   HAZEL, RN, [place] O - 03/02/2018 18:34 EST   Pain Description   Laterality :   Left   Pain Location :   Chest   HAZEL, RN,  O - 03/02/2018 18:34 EST

## 2018-03-02 NOTE — Nursing Note (Signed)
Medication Administration Follow Up-Text       Medication Administration Follow Up Entered On:  03/02/2018 22:25 EST    Performed On:  03/02/2018 22:25 EST by DAVIS, RN, [place] M      Intervention Information:     hydromorphone  Performed by DAVIS, RN, [place] M on 03/02/2018 21:23:00 EST       hydromorphone,2mg   Oral,moderate pain (4-7)       Med Response   ED Medication Response :   Symptoms improved   Pasero Opioid Induced Sedation Scale :   1 = Awake and alert   Respiratory Rate :   18 br/min   DAVIS, RN, [place] M - 03/02/2018 22:25 EST

## 2018-03-02 NOTE — Nursing Note (Signed)
Medication Administration Follow Up-Text       Medication Administration Follow Up Entered On:  03/02/2018 19:08 EST    Performed On:  03/02/2018 19:07 EST by HAZEL, RN, [place] O      Intervention Information:     ondansetron  Performed by HAZEL, RN, [place] O on 03/02/2018 18:33:00 EST       ondansetron,4mg   IV Push,Forearm, Mid Right,nausea/vomiting       Med Response   ED Medication Response :   No adverse reaction, Symptoms improved   Pasero Opioid Induced Sedation Scale :   1 = Awake and alert   HAZEL, RN, [place] O - 03/02/2018 19:07 EST

## 2018-03-02 NOTE — Nursing Note (Signed)
Medication Administration Follow Up-Text       Medication Administration Follow Up Entered On:  03/02/2018 23:19 EST    Performed On:  03/02/2018 23:19 EST by DAVIS, RN, [place] M      Intervention Information:     morphine  Performed by DAVIS, RN, [place] M on 03/02/2018 22:39:00 EST       morphine,1mg   IV Push,Jugular, Internal Left,severe pain (8-10)       Med Response   ED Medication Response :   Symptoms improved   Pasero Opioid Induced Sedation Scale :   S = Sleep, easy to arouse   Respiratory Rate :   18 br/min   DAVIS, RN, [place] M - 03/02/2018 23:19 EST

## 2018-03-02 NOTE — Anesthesia Post-Procedure Evaluation (Signed)
Postanesthesia Evaluation        Patient:   Elveria RoyalsLLEN, Guillermina T            MRN: [id number]            FIN: [phone]               Age:   [age] years     Sex:  Female     DOB:  02/08/1961   Associated Diagnoses:   None   Author:   Samarrah Tranchina-MD,  Coralee Edberg J      Postoperative Information   Post Operative Info:          Post operative day: Post Anesthesia Care Unit.         Patient location: PACU.       Assessment   Postanesthesia assessment   Respiratory function: Respiratory rate, airway, and oxygen saturation are at adequate levels.     Cardiovascular function: Heart Rate stable, Blood Pressure stable, Postoperative hydration status Adequate.     Mental status: appropriate for level of anesthesia.     Temperature: within normal limits.     Pain Control: Adequate.     Nausea/Vomiting: Absent.     Signature Line     Electronically Signed on 03/02/2018 11:14 AM EST   ________________________________________________   Asal Teas-MD,  Kermit BaloMATTHEW J

## 2018-03-02 NOTE — Op Note (Signed)
Phase II Record - [facility]             Phase II Record - [facility] Summary                                                                  Primary Physician:        Hale Drone    Case Number:              [id number]    Finalized Date/Time:      03/02/18 13:22:05    Pt. Name:                 Wanda Larson, Wanda Larson    D.O.B./Sex:               1960-05-01    Female    Med Rec #:                [id number]    Physician:                Hale Drone    Financial #:              [id number]    Pt. Type:                 I    Room/Bed:                 3002/01    Admit/Disch:              03/02/18 06:28:00 -    Institution:       [facility] Case Attendance - Phase II                                                                                           Entry 1                         Entry 2                                                                          Case Attendee             Tollie Pizza, RN, Tana Conch, RN, AMY B    Role Performed            Post Anesthesia Care            Post Anesthesia Care  Nurse                           Nurse    Time In     Time Out     Last Modified By:         Tollie PizzaFreitas, RN, Alric RanStevie C           Freitas, RN, Donalda EwingsStevie C                              03/02/18 13:04:03               03/02/18 13:04:03      [facility] - Case Times - Phase II                                                                                              Entry 1                                                                                                          Phase II In               03/02/18 12:27:00               Phase II Out                    03/02/18 13:10:00    Phase II Discharge        03/02/18 13:10:00    Time     Last Modified By:         Tollie PizzaFreitas, RN, Stevie C                              03/02/18 13:21:52              Finalized By: Tollie PizzaFreitas, RN, Donalda EwingsStevie C      Document Signatures                                                                             Signed By:            Tollie PizzaFreitas, RN, Stevie C 03/02/18 13:22

## 2018-03-02 NOTE — Nursing Note (Signed)
Medication Administration Follow Up-Text       Medication Administration Follow Up Entered On:  03/02/2018 12:24 EST    Performed On:  03/02/2018 12:12 EST by Tollie PizzaFreitas, RN, Donalda EwingsStevie C      Intervention Information:     hydromorphone  Performed by Tollie PizzaFreitas, RN, Ste[place] C on 03/02/2018 11:57:00 EST       hydromorphone,0.5mg   IV Push,Forearm, Mid Right,other (see comment)       Med Response   ED Medication Response :   Symptoms improved   Dorothey BasemanFreitas, RN, Stevie C - 03/02/2018 12:24 EST

## 2018-03-02 NOTE — Procedures (Signed)
IntraOp Record - [facility]             IntraOp Record - [facility] Summary                                                                   Primary Physician:        Tessie Eke    Case Number:              [phone]    Finalized Date/Time:      03/02/18 11:37:38    Pt. Name:                 Wanda Larson, Wanda Larson    D.O.B./Sex:               [birth year]    Female    Med Rec #:                [id number]    Physician:                Tessie Eke    Financial #:              [id number]    Pt. Type:                 I    Room/Bed:                 /    Admit/Disch:              03/02/18 06:28:00 -    Institution:       [facility] - Case Times                                                                                                         Entry 1                                                                                                          Patient      In Room Time             03/02/18 07:30:00               Out Room Time                   03/02/18 11:06:00    Anesthesia     Procedure  Start Time               03/02/18 08:15:00               Stop Time                       03/02/18 10:59:00    Last Modified By:         Glendell Docker RN, ALISA A                              03/02/18 11:37:29      [facility] - Case Times Audit                                                                          03/02/18 11:37:29         Owner: [id number]                              Modifier: [email or web address]                                                         <+> 1         Out Room Time     03/02/18 10:59:21         Owner: [id number]                              Modifier: [id number]                                                       <+> 1         Stop Time     03/02/18 08:18:47         Owner: [id number]                              Modifier: [id number]                                                       <+> 1         Start Time        [facility] - Case Attendance  Entry 1                         Entry 2                         Entry 3                                          Case Attendee             MILLIRON-MD,  MATTHEW J         KLINE-MD,  Thurman Coyer, RN, ALISA A    Role Performed            Anesthesiologist                Surgeon Primary                 Circulator    Time In                   03/02/18 07:30:00               03/02/18 07:30:00               03/02/18 07:30:00    Time Out                  03/02/18 11:06:00               03/02/18 11:06:00               03/02/18 11:06:00    Procedure                 Lung Biopsy and/or              Lung Biopsy and/or                              Wedge Resection SI              Wedge Resection SI                              Ro(Left), Lung                  Ro(Left), Lung                              Lobectomy SI                    Lobectomy SI                              Robotic(Lymph Node,             Robotic(Lymph Node,                              Removal)                        Removal)    Last Modified By:  CARL, RN, Shanon Payor, RN, Shanon Payor, RN, ALISA A                              03/02/18 11:37:36               03/02/18 11:37:36               03/02/18 11:37:36                                Entry 4                         Entry 5                         Entry 6                                          Case Attendee             [place], RN, Darrelyn Hillock,  [place]                Angle,  Newman Pies    Role Performed            [place]                        First Assistant                 Surgical Scrub    Time In                   03/02/18 07:30:00               03/02/18 07:30:00               03/02/18 07:30:00    Time Out                  03/02/18 11:06:00               03/02/18 11:06:00               03/02/18 11:06:00    Procedure     Last Modified By:         Glendell Docker RN, ALISA Montey Hora, RN, Shanon Payor, RN, ALISA A                               03/02/18 11:37:36               03/02/18 11:37:36               03/02/18 11:37:36  Entry 7                         Entry 8                                                                          Case Attendee             [place], RN, LISA A            MACE-MD,  ADAM G    Role Performed            Other Authorized                Surgeon Secondary                              Personnel    Time In                   03/02/18 07:30:00               03/02/18 07:30:00    Time Out                  03/02/18 08:10:00               03/02/18 11:06:00    Procedure     Last Modified By:         Leverne Humbles RN, Rayetta Humphrey, RN, ALISA A                              03/02/18 08:39:21               03/02/18 11:37:36    General Comments:            Lucila Maine MS3      [facility] - Case Attendance Audit                                                                     03/02/18 11:37:36         Owner: [id number]                              Modifier: [email or web address]                                                             1     <+> Time Out            1     <*> Procedure  Lung Biopsy and/or Wedge Resection SI Ro(Left), Lung Lobectomy                                                             SI Robotic(Lymph Node, Removal)            2     <+> Time Out            2     <*> Procedure                              Lung Biopsy and/or Wedge Resection SI Ro(Left), Lung Lobectomy                                                             SI Robotic(Lymph Node, Removal)        <+> 3         Time Out        <+> 4         Time Out        <+> 5         Time Out        <+> 6         Time Out        <+> 8         Time Out     03/02/18 08:56:12         Owner: [id number]                              Modifier: [id number]                                                           8     <*> Role Performed     03/02/18 08:48:48         Owner: [id number]                               Modifier: [id number]                                                           1     <*> Procedure            1     <*> Procedure            1     <*> Procedure                              Lung Biopsy  and/or Wedge Resection SI Ro(Left), Lung Lobectomy                                                             SI Robotic(Lymph Node, Removal)            1     <*> Procedure                              Lung Biopsy and/or Wedge Resection SI Ro(Left), Lung Lobectomy                                                             SI Robotic(Lymph Node, Removal)            1     <*> Procedure                              Lung Biopsy and/or Wedge Resection SI Ro(Left), Lung Lobectomy                                                             SI Robotic(Lymph Node, Removal)            2     <*> Procedure            2     <*> Procedure            2     <*> Procedure                              Lung Biopsy and/or Wedge Resection SI Ro(Left), Lung Lobectomy                                                             SI Robotic(Lymph Node, Removal)            2     <*> Procedure                              Lung Biopsy and/or Wedge Resection SI Ro(Left), Lung Lobectomy                                                             SI Robotic(Lymph Node, Removal)            2     <*>  Procedure                              Lung Biopsy and/or Wedge Resection SI Ro(Left), Lung Lobectomy                                                             SI Robotic(Lymph Node, Removal)            8     <*> Time In            8     <*> Time In     03/02/18 08:39:21         Owner: [id number]                              Modifier: [id number]                                                           1     <*> Procedure                              Lung Biopsy and/or Wedge Resection SI Ro(Left), Lung Lobectomy                                                             SI Robotic(Lymph Node, Removal)            1     <*> Procedure                               Lung Biopsy and/or Wedge Resection SI Ro(Left), Lung Lobectomy                                                             SI Robotic(Lymph Node, Removal)            1     <*> Procedure                              Lung Biopsy and/or Wedge Resection SI Ro(Left), Lung Lobectomy                                                             SI Robotic(Lymph Node, Removal)  2     <*> Procedure                              Lung Biopsy and/or Wedge Resection SI Ro(Left), Lung Lobectomy                                                             SI Robotic(Lymph Node, Removal)            2     <*> Procedure                              Lung Biopsy and/or Wedge Resection SI Ro(Left), Lung Lobectomy                                                             SI Robotic(Lymph Node, Removal)            2     <*> Procedure                              Lung Biopsy and/or Wedge Resection SI Ro(Left), Lung Lobectomy                                                             SI Robotic(Lymph Node, Removal)            5     <*> Time In            5     <*> Time In            6     <*> Time In            6     <*> Time In            7     <*> Time In            7     <*> Time In            8     <*> Case Attendee                          MACE-MD,  ADAM G            8     <*> Case Attendee                          MACE-MD,  ADAM G     03/02/18 08:28:11         Owner: [id number]  Modifier: [id number]                                                           1     <*> Procedure                              Lung Biopsy and/or Wedge Resection SI Ro(Left), Lung Lobectomy                                                             SI Robotic(Lymph Node, Removal)            1     <*> Procedure                              Lung Biopsy and/or Wedge Resection SI Ro(Left), Lung Lobectomy                                                             SI Robotic(Lymph Node, Removal)            1     <*> Procedure                               Lung Biopsy and/or Wedge Resection SI Ro(Left), Lung Lobectomy                                                             SI Robotic(Lymph Node, Removal)            2     <*> Procedure                              Lung Biopsy and/or Wedge Resection SI Ro(Left), Lung Lobectomy                                                             SI Robotic(Lymph Node, Removal)            2     <*> Procedure                              Lung Biopsy and/or Wedge Resection SI Ro(Left), Lung Lobectomy  SI Robotic(Lymph Node, Removal)            2     <*> Procedure                              Lung Biopsy and/or Wedge Resection SI Ro(Left), Lung Lobectomy                                                             SI Robotic(Lymph Node, Removal)            3     <*> Time In            3     <*> Time In            4     <*> Time In            4     <*> Time In            5     <*> Case Attendee                          [place],  Maegan D            5     <*> Case Attendee                          [place],  Maegan D            5     <*> Role Performed            5     <*> Role Performed            6     <*> Case Attendee                          Angle,  Anna C            6     <*> Case Attendee                          Angle,  Anna C            6     <*> Role Performed            6     <*> Role Performed            7     <*> Case Attendee                          [place], RN, LISA A            7     <*> Case Attendee                          [place], RN, LISA A            7     <*> Role Performed            7     <*>  Role Performed            7     <*> Time Out            7     <*> Time Out     03/02/18 08:24:35         Owner: [id number]                              Modifier: [id number]                                                           3     <*> Case Attendee                          Glendell Docker, RN, ALISA A            3     <*> Case Attendee                           CARL, RN, ALISA A            3     <*> Case Attendee                          CARL, RN, ALISA A            3     <*> Role Performed            3     <*> Role Performed            3     <*> Role Performed            4     <*> Case Attendee                          DeSouza, RN, Lauren E            4     <*> Case Attendee                          DeSouza, RN, Lauren E            4     <*> Case Attendee                          Leverne Humbles, RN, Lauren E            4     <*> Role Performed            4     <*> Role Performed            4     <*> Role Performed     03/02/18 07:35:52         Owner: [id number]                              Modifier: [id number]  1     <*> Time In            1     <*> Time In            1     <*> Time In            1     <*> Procedure            1     <*> Procedure                              Lung Biopsy and/or Wedge Resection SI Ro(Left), Lung Lobectomy                                                             SI Robotic(Lymph Node, Removal)            1     <*> Procedure                              Lung Biopsy and/or Wedge Resection SI Ro(Left), Lung Lobectomy                                                             SI Robotic(Lymph Node, Removal)            1     <*> Procedure                              Lung Biopsy and/or Wedge Resection SI Ro(Left), Lung Lobectomy                                                             SI Robotic(Lymph Node, Removal)            1     <*> Procedure                              Lung Biopsy and/or Wedge Resection SI Ro(Left), Lung Lobectomy                                                             SI Robotic(Lymph Node, Removal)            2     <*> Time In            2     <*> Time In            2     <*> Time  In            2     <*> Procedure            2     <*> Procedure                              Lung Biopsy and/or Wedge Resection SI Ro(Left), Lung Lobectomy                                                              SI Robotic(Lymph Node, Removal)            2     <*> Procedure                              Lung Biopsy and/or Wedge Resection SI Ro(Left), Lung Lobectomy                                                             SI Robotic(Lymph Node, Removal)            2     <*> Procedure                              Lung Biopsy and/or Wedge Resection SI Ro(Left), Lung Lobectomy                                                             SI Robotic(Lymph Node, Removal)            2     <*> Procedure                              Lung Biopsy and/or Wedge Resection SI Ro(Left), Lung Lobectomy                                                             SI Robotic(Lymph Node, Removal)        [facility] - Skin Assessment                                                                          Pre-Care Text:            A.240 Assesses baseline  skin condition Im.120 Implements protective measures to prevent skin or tissue injury           due to mechanical sources  Im.280.1 Implements progective measures to prevent skin or tissue injury due to           thermal sources Im.360 Monitors for signs and symptons of infection                              Entry 1                                                                                                          Skin Integrity            Not Intact/Abnormality          Abnormality                     SMALL CUT ON RIGHT RING                                                              Description                     FINGER, SMALL ABRASION                                                                                              ON LEFT WRIST, BRUISE                                                                                              ON RIGHT LOWER ABDOMEN    Last Modified By:         Leverne Humbles RN, Kyla Balzarine                              03/02/18 08:41:22    Post-Care Text:            E.10 Evaluates for signs and symptoms of physical injury to skin  and  tissue E.270 Evaluate tissue perfusion           O.60 Patient is free from signs and symptoms of injury caused by extraneous objects   O.210 Patinet's tissue           perfusion is consistent with or improved from baseline levels      [facility] - Patient Positioning                                                                      Pre-Care Text:            A.240 Assesses baseline skin condition A.280 Identifies baseline musculoskeletal status A.280.1 Identifies           physical alterations that require additional precautions for procedure-specific positioning A.510.8 Maintains           patient's dignity and privacy Im.120 Implements protective measures to prevent skin/tissue injury due to           mechanical sources Im.40 Positions the patient Im.80 Applies safety devices                              Entry 1                                                                                                          Procedure                 Lung Biopsy and/or              Body Position                   Lateral Left Up                              Wedge Resection SI                              Ro(Left), Lung                              Lobectomy SI                              Robotic(Lymph Node,                              Removal)    Left Arm Position         Secured in Limb  Right Arm Position              Flexed                              Positioner    Left Leg Position         Extended Security               Right Leg Position              Flexed                              Strap, Pillow Under                              Knee, Pillow Under                              Lower Leg    Feet Uncrossed            Yes                             Pressure Points                 Yes                                                              Checked     Additional                RIGHT  AXILLARY ROLL,           Positioning Device              Arm Cradle Foam/Gel,    Information               RIGHT LEG  FLEXED ON                                             Axillary Roll, Bean                              FOAM CRADLE. RIGHT ARM                                          Bag, Foam Padding, Head                              PADDED & FLEXED BY                                              Rest Gel, Pillow,  HEAD. LEFT ARM PADDED &                                         Safety Strap                              SECURED IN KRAUSE ARM                              HOLDER. PT SECURED WITH                              BEANBAG & AT HIPS & LEGS    Positioned By             MILLIRON-MD,  MATTHEW           Outcome Met (O.80)              Yes                              J, KLINE-MD,  Thurman Coyer, RN, Lindajo Royal,                              DeSouza, RN, Darrelyn Hillock,  Maegan D    Last Modified By:         Leverne Humbles RN, Lauren E                              03/02/18 09:17:49    Post-Care Text:            A.240 Assesses baseline skin condition A.280 Identifies baseline musculoskeletal status A.280.1 Identifies           physical alterations that require additional precautions for procedure-specific positioning A.510.8 Maintains           patient's dignity and privacy Im.120 Implements protective measures to prevent skin/tissue injury due to           mechanical sources Im.40 Positions the patient Im.80 Applies safety devices      [facility] - Patient Positioning Audit                                                                 03/02/18 09:17:49         Owner: [id number]                              Modifier: [id number]  1     <+> Body Position            1     <*> Procedure                              Lung Biopsy and/or Wedge Resection SI Ro(Left), Lung Lobectomy                                                             SI Robotic(Lymph Node, Removal)        [facility] - Skin Prep                                                                                 Pre-Care Text:            A.30 Verifies allergies A.20 Verifies procedure, surgical site, and laterality A.510.8 Maintains paritnet's           dignity and privacy Im.270 Performs Skin Preparation Im.270.1 Implements protective measures to prevent skin           and tissue injury due to chemical sources  A.300.1 Protects from cross-contamination                              Entry 1                                                                                                          Hair Removal     Skin Prep      Prep Agents (Im.270)     Chlorhexidine Gluconate         Prep Area (Im.270)              Chest and Trunk                              2% w/Alcohol     Prep Area Details        Left                            Prep By                         Leverne Humbles, RN, Lauren E    Outcome Met (O.100)       Yes    Last Modified By:  DeSouza, RN, Kyla Balzarine                              03/02/18 08:44:13    Post-Care Text:            E.10 Evaluates for signs and symptoms of physical injury to skin and tissue O.100 Patient is free from signs           and symptoms of chemical injury  O.740 The patient's right to privacy is maintained      [facility] - Counts Initial and Final                                                                 Pre-Care Text:            A.20.2 - Assesses the risk for unintended retained surgical items Im.20 - Performs required counts                              Entry 1                                                                                                          Initial Counts      Initial Counts           DeSouza, RN, Lauren E,          Items included in               Instruments, Sponges,     Performed By             Tiburcio Bash                  the Initial Count               Sharps    Final Counts      Final Counts             CARL, RN, ALISA A               Final Count Status              Correct     Performed By       Items Included in        Sponges, Sharps     Final Count     Outcome Met (O.20)        Yes    Last Modified By:         Vertell Limber                              03/02/18 10:58:15    Post-Care Text:  E.50 - Evaluates results of the surgical count O.20 - Patient is free from unintended retained surgical items      [facility] - Counts Initial and Final Audit                                                            03/02/18 10:58:15         Owner: [id number]                              Modifier: [id number]                                                       <+> 1         Final Counts Performed By        <+> 1         Final Count Status        <+> 1         Items Included in Final Count        <+> 1         Outcome Met (O.20)        [facility] - Counts Additional                                                                        Pre-Care Text:            A.38 Verifies operative procedure, sugical site, and laterality A.20.2 Assesses the risk for unintended           retained foreign body Im.20 Performs required counts                              Entry 1                                                                                                          Additional Count          Closing Count                   Additional Count                DeSouza, RN, Lauren E,    Type  Participants                    Angle,  Newman Pies    Count Status              Correct                         Items Counted                   Sponges, Sharps    Outcome Met (O.20)        Yes    Last Modified By:         Leverne Humbles RN, Lauren E                              03/02/18 10:50:52    Post-Care Text:            E.50 Evaluates results of the surgical count O.20 Patient is free from unintended retained foreign objects      [facility] - General Case Data                                                                        Pre-Care Text:            A.350.1 Classifies surgical wound                               Entry 1                                                                                                          Case Information      ASA Class                3                               Case Level                      Level 4     OR                       RH7 05                          Specialty                       Robotics (SN)     Wound Class              2-Clean-Contaminated  Preop Diagnosis           LUNG CA                         Postop Diagnosis                LEFT LUNG CANCER    Last Modified By:         Leverne Humbles RN, Kyla Balzarine                              03/02/18 09:16:48    Post-Care Text:            O.760 Patient receives consistent and comparable care regardless of the setting      [facility] - General Case Data Audit                                                                   03/02/18 09:16:48         Owner: [id number]                              Modifier: [id number]                                                       <+> 1         Postop Diagnosis        [facility] - Fire Risk Assessment                                                                                               Entry 1                                                                                                          Fire Risk                 Alcohol Based Prep              Fire Risk Score                 3    Assessment: If  Solution, Surgical Site    checked, checkmark        Above Xiphoid, Ignition    = 1 point                 Source In Use    Last Modified By:         Leverne Humbles, RN, Kyla Balzarine                              03/02/18 08:25:40      [facility] - Time Out                                                                                 Pre-Care Text:            A.10 Confirms patient identity A.20 Verifies operative procedure, surgical site, and laterality A.20.1 Verifies           consent for planned procedure A.30 Verifies allergies                              Entry 1                                                                                                           Procedure                 Lung Biopsy and/or              Is everyone ready               Yes                              Wedge Resection SI              to perform time out                               Ro(Left), Lung                              Lobectomy SI                              Robotic(Lymph Node,                              Removal)    Have all members of       Yes  Patient name and                Yes    the surgical team                                         DOB confirmed     been introduced     Allergies discussed       Yes                             Surgical procedure              Yes                                                              to be performed                                                               confirmed and                                                               verified by                                                               completed surgical                                                               consent     Correct surgical          Yes                             Correct laterality              Yes    site marked and                                           confirmed     initials are     visible through     prepped and draped     field (or     alternative ID band     used)     Correct  patient           Yes                             Surgeon shares                  Yes    position confirmed                                        operative plan,                                                               possible                                                               difficulties,                                                               expected duration,                                                               anticipated blood                                                               loss and reviews                                                                all                                                               critical/specific  concerns     Required blood            Yes                             Essential imaging               Yes    products, implants,                                       available and fetal     devices and/or                                            heartones confirmed     special equipment                                         (if applicable)     available and     sterility confirmed     VTE prophylaxis           Yes                             Antibiotics ordered             Yes    addressed                                                 and administered     Anesthesia shares         Yes                             Fire risk                       Yes    anesthetic plan and                                       assessment scored     reviews patient                                           and plan discussed     specific concerns     Appropriate drying        Yes                             Surgeon states:                 Yes    time for prep  Does anyone have     observed before                                           any concerns? If     draping                                                   you see, suspect,                                                               or feel that                                                               patient care is                                                               being compromised,                                                               speak up for                                                               patient safety     Time Out Complete         03/02/18 08:12:00    Last Modified By:         Leverne Humbles RN, Lauren E                              03/02/18 08:26:56    Post-Care Text:            E.30 Evaluates  verification process for correct patient, site, side, and level surgery      [facility] - Debrief  Pre-Care Text:            Im.330 Manages specimen handling and disposition                              Entry 1                                                                                                          Procedure                 Lung Biopsy and/or              Final counts                    Yes                              Wedge Resection SI              correct and                               Ro(Left), Lung                  verbally verified                               Lobectomy SI                    with                               Robotic(Lymph Node,             surgeon/licensed                               Removal)                        independent                                                               practitioner (if                                                               applicable)     Actual procedure  Yes                             Postop diagnosis                Yes    performed confirmed                                       confirmed     Wound                     Yes                             Confirm specimens               Yes    classification                                            and specimens     confirmed                                                 labeled                                                               appropriately (if                                                               applicable)     Foley catheter            Yes                             Patient recovery                Yes    removed (if                                               plan confirmed     applicable)     Debrief Complete          03/02/18 10:54:00    Last Modified By:         Vertell Limber                              03/02/18 10:54:29    Post-Care Text:            E.800  Ensures continuity  of care E.50 Evaluates results of the surgical count O.30 Patient's procedure is           performed on the correct site, side, and level O.50 patient's current status is communicated throughout the           continuum of care O.40 Patient's specimen(s) is managed in the appropriate manner      [facility] - Debrief Audit                                                                             03/02/18 10:54:29         Owner: [id number]                              Modifier: [id number]                                                           1     <*> Procedure                              Lung Biopsy and/or Wedge Resection SI Ro(Left), Lung Lobectomy                                                             SI Robotic(Lymph Node, Removal)            1     <+> Debrief Complete        [facility] - Cautery                                                                                  Pre-Care Text:            A.240 Assesses baseline skin condition A280.1 Identifies baseline musculoskeletal status Im.50 Implements           protective measures to prevent injury due to electrical sources  Im.60 Uses supplies and equipment within safe           parameters Im.80 Applies safety devices                              Entry 1  ESU Type                  GENERATOR                       Identification                  [id number]                              [facility]              Number     Coag Setting (watts)      35                              Cut Setting (watts)             35    Bipolar Setting           35                              Grounding Pad                   Yes    (watts)                                                   Needed?     Grounding Pad Site        Thigh, left                     Grounding Pad                   CARL, Therapist, sports, ALISA A                                                               Applied By     Outcome Met (O.10)        Yes    Last Modified By:         Leverne Humbles, RN, Kyla Balzarine                              03/02/18 08:48:28    Post-Care Text:            E.10 Evaluates for signs and symptoms of physical injury to skin and tissue O.10 Patient is free from signs and           symptoms of injury related to thermal sources  O.70 Patient is free from signs and symptoms of electrical injury      [facility] - Patient Care Devices  Pre-Care Text:            A.200 Assesses risk for normothermia regulation A.40 Verifies presence of prosthetics or corrective devices           Im.280 Implements thermoregulation measures Im.60 Uses supplies and equipment within safe parameters                              Entry 1                         Entry 2                                                                          Equipment Type            [place]    SCD Sleeve Site                                           Legs Bilateral    Equipment/Tag Number      [id number]                       [id number]    Initiated Pre                                             Yes    Induction     Last Modified By:         Leverne Humbles RN, Areta Haber, RN, Lauren E                              03/02/18 08:53:33               03/02/18 08:53:33    Post-Care Text:            E.10 Evaluates signs and symptoms of physical injury to skin and tissue O.60 Patient is free from signs and           symptoms of injury caused by extraneous objects    General Comments:            LOWER BODY BAIR HUGGER SET ON HIGH      [facility] - Medications  Pre-Care Text:            A.10 Confirms patient identity A.30 Verifies allergies Im.220 Administers prescribed medications Im.220.2           Administers  prescribed antibiotic therapy as ordered                              Entry 1                                                                                                          Time Administered         03/02/18 10:41:00               Medication                      BUPIVACAINE 0.25%                                                                                              EPINEPHRINE INJECTION                                                                                              30ML    Route of Admin            Local Injection                 Dose/Volume                     55MLS                                                              (include amount and  unit of measure)     Site                      Chest                           Site Detail                     Left    Administered By           MILLIRON-MD,  MATTHEW J         Outcome Met (O.130)             Yes    Last Modified By:         Leverne Humbles RN, Lauren E                              03/02/18 10:52:54    Post-Care Text:            E.20 Evaluates response to medications O.130 Patient receives appropriately administerd medication(s)      [facility] - Medications Audit                                                                         03/02/18 10:52:54         Owner: [id number]                              Modifier: [id number]                                                           1     <*> Medication                             BUPIVACAINE 0.25% EPINEPHRINE INJECTION 30ML            1     <+> Dose/Volume (include amount and                      unit of measure)     03/02/18 10:42:39         Owner: [id number]                              Modifier: [id number]                                                           1     <*> Medication  BUPIVACAINE 0.25% EPINEPHRINE INJECTION 30ML            1     <+> Time Administered            1     <+> Outcome Met  (O.130)        [facility] - Specimens                                                                                                          Entry 1                         Entry 2                         Entry 3                                          Description               9 L LYMPH NODE                  STATION 8 LYMPH NODE            LEFT LOWER LOBE WEDGE                                                                                              RESECTION    Specimen Type             Routine                         Routine                         Frozen    Date/Time in     Formalin (for     breast tissue only)     Last Modified By:         Leverne Humbles RN, Areta Haber, RN, Areta Haber, RN, Lauren E                              03/02/18 08:51:50               03/02/18 08:51:50               03/02/18 08:51:50  Entry 4                         Entry 5                         Entry 6                                          Description               STATION 7 LYMPH NODE            10 L LYMPH NODE                 11 L LYMPH NODE    Specimen Type             Routine                         Routine                         Routine    Date/Time in     Formalin (for     breast tissue only)     Last Modified By:         Leverne Humbles RN, Areta Haber, RN, Areta Haber, RN, Lauren E                              03/02/18 08:55:12               03/02/18 09:02:20               03/02/18 09:47:07                                Entry 7                         Entry 8                                                                          Description               STATION 5 LYMPH NODE            LEFT LOWER LOBE LUNG    Specimen Type             Routine                         Routine    Date/Time in     Formalin (for     breast tissue only)     Last Modified By:         Leverne Humbles RN, Kyla Balzarine  DeSouza, RN, Lauren E                               03/02/18 10:33:20               03/02/18 10:50:20      [facility] - Specimens Audit                                                                           03/02/18 10:50:20         Owner: [id number]                              Modifier: [id number]                                                       <+> 8         Description        <+> 8         Specimen Type     03/02/18 10:33:20         Owner: [id number]                              Modifier: [id number]                                                       <+> 7         Description        <+> 7         Specimen Type     03/02/18 09:47:07         Owner: [id number]                              Modifier: [id number]                                                       <+> 6         Description        <+> 6         Specimen Type     03/02/18 09:02:20         Owner: [id number]                              Modifier: [id number]                                                       <+>  5         Description        <+> 5         Specimen Type     03/02/18 08:55:12         Owner: [id number]                              Modifier: [id number]                                                       <+> 4         Description        <+> 4         Specimen Type        [facility] - Tubes, Drains, Catheters                                                                 Pre-Care Text:            A.310 Identifies factors associated with an increased risk for hemorrhage or fluid and electrolyte imbalance           Im.250 Administers care to invasive device sites                              Entry 1                                                                                                          Device Description        DRAIN WOUND ROUND 24FR          Location                        Chest                              SIL FLUTED    Location Detail           Left    Last Modified By:         Vertell Limber                              03/02/18 09:21:53    Post-Care Text:            E.340 Evaluates tubes and  drains are intact and functioning as planned O.60 Patient is free from signs and  symptoms of injury caused by extraneous objects      [facility] - Communication                                                                            Pre-Care Text:            A.520 Identifies barriers to communication ([employer]) A.20 Verifies operative           procedure, surgical site, and laterality ([employer]) Im.500 Provides status reports to family           members Im.150 Develops individualized plan of care                              Entry 1                         Entry 2                         Entry 3                                          Communication             Face to Face                    Phone Call                      Phone Call    Communication By          Glendell Docker, RN, Shanon Payor, RN, Shanon Payor, RN, ALISA A    Date and Time             03/02/18 07:25:00               03/02/18 09:13:00               03/02/18 10:30:00    Communication To          [place]    Last Modified By:         Leverne Humbles RN, Areta Haber, RN, Areta Haber, RN, Lauren E                              03/02/18 08:54:31               03/02/18 09:17:19  03/02/18 10:31:10                                Entry 4                                                                                                          Communication             Phone Call    Communication By          Glendell Docker, RN, ALISA A    Date and Time             03/02/18 10:51:00    Communication To          PT REP ESTHER    Last Modified By:         Leverne Humbles RN, Lauren E                              03/02/18 10:51:35    Post-Care Text:            E.520 Evaluates psychosocial response to plan of care O.500 Patient or designated support person demonstrates           knowledge of the expected psychosocial  responses to the procedure E.800 Ensures continuity of care O.50           Patient's current status is communicated throughout the continuum of care      [facility] - Communication Audit                                                                       03/02/18 10:51:35         Owner: [id number]                              Modifier: [id number]                                                       <+> 4         Communication        <+> 4         Communication By        <+> 4         Date and Time        <+> 4         Communication To     03/02/18 10:31:10         Owner: [id number]  Modifier: [id number]                                                       <+> 3         Communication        <+> 3         Communication By        <+> 3         Date and Time        <+> 3         Communication To     03/02/18 09:17:19         Owner: [id number]                              Modifier: [id number]                                                       <+> 2         Communication        <+> 2         Communication By        <+> 2         Date and Time        <+> 2         Communication To        [facility] - Dressing/Packing                                                                         Pre-Care Text:            A.350 Assesses susceptibility for infection Im.250 Administers care to invasive devices Im.290 Administer care           to wound sites  Im.300 Implements aseptic technique                              Entry 1                                                                                                          Site                      Chest  Site Details                    Left    Dressing Item     Details      Dressing Item            Liquid Bandage, 4x4's,     (Im.290)                 Tape    Last Modified By:         Leverne Humbles RN, Lauren E                              03/02/18 09:21:14    Post-Care Text:            E.320 Evaluate factors associted with increased risk for  postoperative infection at the completion of the           procedure O.200 Patient's wound perfusion is consistent with or improved from baseline levels  O.Patient is           free from signs and symptoms of infection      [facility] - Procedures                                                                               Pre-Care Text:            A.20 Verifies operative procedure, surgical site, and laterality Im.150 Develops individualized plan of care                              Entry 1                         Entry 2                                                                          Procedure     Description      Procedure                Lung Biopsy and/or              Lung Lobectomy SI                              Wedge Resection SI              Robotic                              Robotic     Modifiers                Left  Lymph Node, Removal     Surgical Procedure       SI ROBOTIC  LEFT LOWER          SI ROBOTIC  LOBECTOMY /     Text                     LOBE WEDGE RESECTION            MLND    Primary Procedure         Yes                             No    Primary Surgeon           Markus [facility]    Start                     03/02/18 08:15:00               03/02/18 08:15:00    Stop                      03/02/18 10:59:00               03/02/18 10:59:00    Anesthesia Type           General                         General    Surgical Service          Robotics (SN)                   Robotics (SN)    Wound Class               1-Clean                         2-Clean-Contaminated    Last Modified By:         Leverne Humbles RN, Areta Haber, RN, Lauren E                              03/02/18 10:59:26               03/02/18 10:59:26    Post-Care Text:            O.730 The patinet's care is consistent with the individualized perioperative plan of care      [facility] - Procedures Audit                                                                           03/02/18 10:59:26         Owner: [id number]                              Modifier: [id number]                                                       <+>  1         Stop        <+> 2         Stop     03/02/18 09:20:03         Owner: [id number]                              Modifier: [id number]                                                           2     <*> Procedure                              Lung Lobectomy SI Robotic            2     <*> Modifiers                              Possible            2     <*> Surgical Procedure Text                POSS SI ROBOTIC  LOBECTOMY / MLND        RH[facility] - Transfer                                                                                                           Entry 1                                                                                                          Transferred By            MILLIRON-MD,  MATTHEW           Via                             Stretcher                              Heide Guile, RN, ALISA A,  DeSouza, RN, Kyla Balzarine    Post-op Destination       PACU    Skin Assessment      Condition                Intact    Last Modified By:         Vertell Limber                              03/02/18 10:57:43      Case Comments                                                                                         <None>              Finalized By: Glendell Docker RN, ALISA A      Document Signatures                                                                             Signed By:           Glendell Docker RN, ALISA A 03/02/18 11:37

## 2018-03-02 NOTE — Nursing Note (Signed)
Medication Administration Follow Up-Text       Medication Administration Follow Up Entered On:  03/02/2018 14:27 EST    Performed On:  03/02/2018 12:36 EST by HOFFMAN, RN, ROBIN A      Intervention Information:     ondansetron  Performed by Tollie PizzaFreitas, RN, Stevie C on 03/02/2018 12:21:00 EST       ondansetron,4mg   IV Push,Forearm, Mid Right,nausea/vomiting       Med Response   ED Medication Response :   Symptoms improved   [place] Opioid Induced Sedation Scale :   1 = Awake and alert   HOFFMAN, RN, ROBIN A - 03/02/2018 14:27 EST

## 2018-03-02 NOTE — Assessment & Plan Note (Signed)
PreOp Record - [facility]OR             PreOp Record - RHOR Summary                                                                     Primary Physician:        Hale Drone    Case Number:              [id number]    Finalized Date/Time:      03/02/18 07:00:30    Pt. Name:                 KYRAN, WHITTIER    D.O.B./Sex:               [birth year]    Female    Med Rec #:                [id number]    Physician:                Hale Drone    Financial #:              [id number]    Pt. Type:                 I    Room/Bed:                 /    Admit/Disch:              03/02/18 06:28:00 -    Institution:       [facility] Case Attendance - PreOp                                                                                              Entry 1                         Entry 2                                                                          Case Attendee             Tonette Bihari, RN, JONI B    Role Performed            Surgeon Primary                 Preoperative Nurse    Time In     Time Out     Last Modified  By:         SUGGS, RN, Joannie SpringsJONI B               SUGGS, RN, JONI B                              03/02/18 16:10:9606:27:09               03/02/18 06:27:09      [facility] - Case Times - PreOp                                                                                                 Entry 1                                                                                                          Patient In Room Time      03/02/18 06:20:00               Nurse In Time                   03/02/18 06:27:00    Nurse Out Time            03/02/18 07:00:00               Patient Ready for               03/02/18 07:00:00                                                              Surgery/Procedure     Last Modified By:         Carin HockSUGGS, RN, JONI B                              03/02/18 07:00:22      [facility] - Case Times - PreOp Audit                                                                  03/02/18  07:00:22         Owner: Barbaraann RondoSUGGSJ  Modifier: [email or web address]                                                        <+> 1         Patient Ready for Surgery/Procedure        <+> 1         Nurse Out Time                Finalized By: Carin Hock RN, Delaney Meigs B      Document Signatures                                                                             Signed By:           Carin Hock RN, JONI B 03/02/18 07:00

## 2018-03-03 LAB — MAGNESIUM: Magnesium: 2.3 mg/dL (ref 1.6–2.6)

## 2018-03-03 LAB — BASIC METABOLIC PANEL
Anion Gap: 11 mmol/L (ref 2–17)
BUN: 20 mg/dL (ref 6–20)
CO2: 27 mmol/L (ref 22–29)
Calcium: 9.6 mg/dL (ref 8.6–10.0)
Chloride: 104 mmol/L (ref 98–107)
Creatinine: 0.4 mg/dL — ABNORMAL LOW (ref 0.5–0.9)
GFR African American: 132 mL/min/{1.73_m2} (ref >=90)
GFR Non-African American: 114 mL/min/{1.73_m2} (ref >=90)
Glucose: 109 mg/dL — ABNORMAL HIGH (ref 70–99)
Osmolaliy Calculated: 284 mosm/kg (ref 270–287)
Potassium: 4.2 mmol/L (ref 3.5–5.3)
Sodium: 141 mmol/L (ref 135–145)

## 2018-03-03 NOTE — Nursing Note (Signed)
Medication Administration Follow Up-Text       Medication Administration Follow Up Entered On:  03/03/2018 13:09 EST    Performed On:  03/03/2018 13:09 EST by HOFFMAN, RN, ROBIN A      Intervention Information:     ketorolac  Performed by [facility], RN, ROBIN A on 03/03/2018 12:38:00 EST       ketorolac,15mg   IV Push,Jugular, Internal Left       Med Response   ED Medication Response :   Symptoms improved   Numeric Rating Pain Scale :   1   Pasero Opioid Induced Sedation Scale :   1 = Awake and alert   HOFFMAN, RN, ROBIN A - 03/03/2018 13:09 EST

## 2018-03-03 NOTE — Nursing Note (Signed)
Medication Administration Follow Up-Text       Medication Administration Follow Up Entered On:  03/03/2018 9:07 EST    Performed On:  03/03/2018 9:06 EST by HOFFMAN, RN, ROBIN A      Intervention Information:     hydromorphone  Performed by DAVIS, RN,  M on 03/03/2018 06:17:00 EST       hydromorphone,2mg   Oral,moderate pain (4-7)       Med Response   ED Medication Response :   Symptoms improved   Numeric Rating Pain Scale :   2   Pasero Opioid Induced Sedation Scale :   1 = Awake and alert   HOFFMAN, RN, ROBIN A - 03/03/2018 9:06 EST

## 2018-03-03 NOTE — Case Communication (Signed)
CM Discharge Planning Assessment - Text       CM Discharge Planning Assessment Entered On:  03/03/2018 9:22 EST    Performed On:  03/03/2018 9:21 EST by Gregary Cromer               Home Environment   Living Environment :   No Living Environment Information Available     Gregary Cromer - 03/03/2018 9:24 EST     Affect/Behavior :   Appropriate   Lives With :   Alone   Lives In :   Apartment   Living Situation :   Home independently   Job Responsibilities :   disabled   Outside Facility Information :   PCP: Dr. Bobbie Stack  Rx: [employer] mail order or Karin Golden   Home Health Provider :   n/a   Barriers at Home :   None   Gregary Cromer - 03/03/2018 9:21 EST   Home Environment   Home Equipment Rehab :   None   ADLs Prior to Admission :   Independent   Gregary Cromer - 03/03/2018 9:21 EST   Discharge Needs I   Previously Documented Discharge Needs :   DISCHARGE PLAN/NEEDS:No discharge data available.  EQUIPMENT/TREATMENT NEEDS:No discharge data available.       Previously Documented Benefits Information :   No discharge data available.     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 She lives alone in [place]. She is disabled, but can drive and has no DME. Her dtr and dtr's family lives on [place] and pt will stay w/them during recovery. She has [employer]. I presented the IM letter and she signed it; she was given a copy. She anticipates d/c Wednesday or Thursday. CM following for needs.     Gregary Cromer - 03/03/2018 9:24 EST     Discharge Needs II   Needs Assistance with Transportation :   No   Discharge Transportation Arranged :   N/A    Needs Assistance at Home Upon Discharge :   Yes   Discharge Planning Time Spent :   25 minutes   Gregary Cromer - 03/03/2018 9:24 EST   Benefits   Admission Medicare Message Provided :   03/03/2018 9:15 EST   Insurance Information :   Managed Medicare   Gregary Cromer - 03/03/2018 9:24 EST   Advance Directive   Advance Directive :   No   Patient Wishes to Receive Further Information on Advance Directives :   No   Gregary Cromer - 03/03/2018 9:24 EST   Discharge Planning   Discharge Arrangements :       Patient Post-Acute Information    Patient Name: Wanda Larson, Wanda Larson  MRN: [id number]  FIN: [id number]  Gender: Female  DOB: 2060-12-01  Age:  57 Years        *** No Post-Acute Placement(s) Listed ***          ***  No Post-Acute Service(s) Listed ***       Discharge Options Discussed with Patient :   Home Health   Discharge Plan Discussion :   Discussed with patient, Patient agrees with plan   Barriers to Discharge Identified :   None identified   Gregary CromerLee,  Jennifer M - 03/03/2018 9:24 EST   Discharge Planning Assessment Status   Discharge Planning Assessment Complete :   Yes   Gregary CromerLee,  Jennifer M - 03/03/2018 9:24 EST

## 2018-03-03 NOTE — Nursing Note (Signed)
Medication Administration Follow Up-Text       Medication Administration Follow Up Entered On:  03/03/2018 1:10 EST    Performed On:  03/03/2018 1:10 EST by DAVIS, RN, [place] M      Intervention Information:     ketorolac  Performed by DAVIS, RN, [place] M on 03/03/2018 00:45:00 EST       ketorolac,15mg   IV Push,Jugular, Internal Left       Med Response   ED Medication Response :   Symptoms improved   DAVIS, RN, [place] M - 03/03/2018 1:10 EST

## 2018-03-03 NOTE — Nursing Note (Signed)
Medication Administration Follow Up-Text       Medication Administration Follow Up Entered On:  03/03/2018 9:06 EST    Performed On:  03/03/2018 9:06 EST by HOFFMAN, RN, ROBIN A      Intervention Information:     ketorolac  Performed by DAVIS, RN, [place] M on 03/03/2018 05:16:00 EST       ketorolac,15mg   IV Push,Jugular, Internal Left       Med Response   ED Medication Response :   No change in symptoms   Numeric Rating Pain Scale :   4   Pasero Opioid Induced Sedation Scale :   1 = Awake and alert   HOFFMAN, RN, ROBIN A - 03/03/2018 9:06 EST

## 2018-03-03 NOTE — Nursing Note (Signed)
Medication Administration Follow Up-Text       Medication Administration Follow Up Entered On:  03/03/2018 18:10 EST    Performed On:  03/03/2018 16:20 EST by HOFFMAN, RN, ROBIN A      Intervention Information:     hydromorphone  Performed by HOFFMAN, RN, ROBIN A on 03/03/2018 15:20:00 EST       hydromorphone,2mg   Oral,moderate pain (4-7)       Med Response   ED Medication Response :   Symptoms improved   Numeric Rating Pain Scale :   2   Pasero Opioid Induced Sedation Scale :   1 = Awake and alert   HOFFMAN, RN, ROBIN A - 03/03/2018 18:10 EST

## 2018-03-03 NOTE — Nursing Note (Signed)
Medication Administration Follow Up-Text       Medication Administration Follow Up Entered On:  03/03/2018 4:29 EST    Performed On:  03/03/2018 4:29 EST by DAVIS, RN, [place] M      Intervention Information:     hydromorphone  Performed by DAVIS, RN, [place] M on 03/03/2018 02:33:00 EST       hydromorphone,2mg   Oral,moderate pain (4-7)       Med Response   ED Medication Response :   Symptoms improved   DAVIS, RN, [place] M - 03/03/2018 4:29 EST

## 2018-03-03 NOTE — Nursing Note (Signed)
Medication Administration Follow Up-Text       Medication Administration Follow Up Entered On:  03/03/2018 13:09 EST    Performed On:  03/03/2018 13:09 EST by HOFFMAN, RN, ROBIN A      Intervention Information:     hydromorphone  Performed by HOFFMAN, RN, ROBIN A on 03/03/2018 10:47:00 EST       hydromorphone,2mg   Oral,moderate pain (4-7)       Med Response   ED Medication Response :   Symptoms improved   Numeric Rating Pain Scale :   2   Pasero Opioid Induced Sedation Scale :   1 = Awake and alert   HOFFMAN, RN, ROBIN A - 03/03/2018 13:09 EST

## 2018-03-03 NOTE — Nursing Note (Signed)
Medication Administration Follow Up-Text       Medication Administration Follow Up Entered On:  03/03/2018 4:29 EST    Performed On:  03/03/2018 4:29 EST by DAVIS, RN, [place] M      Intervention Information:     morphine  Performed by DAVIS, RN, [place] M on 03/03/2018 03:41:00 EST       morphine,1mg   IV Push,Jugular, Internal Left,severe pain (8-10)       Med Response   ED Medication Response :   Symptoms improved   DAVIS, RN, [place] M - 03/03/2018 4:29 EST

## 2018-03-03 NOTE — Nursing Note (Signed)
Medication Administration Follow Up-Text       Medication Administration Follow Up Entered On:  03/03/2018 20:39 EST    Performed On:  03/03/2018 20:39 EST by DAVIS, RN, [place] M      Intervention Information:     ketorolac  Performed by Chad Cordial on 03/03/2018 18:29:00 EST       ketorolac,15mg   IV Push,Wrist, Right       Med Response   ED Medication Response :   Symptoms improved   DAVIS, RN, [place] M - 03/03/2018 20:39 EST

## 2018-03-03 NOTE — Nursing Note (Signed)
Medication Administration Follow Up-Text       Medication Administration Follow Up Entered On:  03/03/2018 9:50 EST    Performed On:  03/03/2018 9:50 EST by HOFFMAN, RN, ROBIN A      Intervention Information:     morphine  Performed by HOFFMAN, RN, ROBIN A on 03/03/2018 08:10:00 EST       morphine,4mg   IV Push,Jugular, Internal Left,severe pain (8-10)       Med Response   ED Medication Response :   Symptoms improved   Numeric Rating Pain Scale :   1   Pasero Opioid Induced Sedation Scale :   1 = Awake and alert   HOFFMAN, RN, ROBIN A - 03/03/2018 9:50 EST

## 2018-03-04 LAB — BASIC METABOLIC PANEL
Anion Gap: 8 mmol/L (ref 2–17)
BUN: 22 mg/dL — ABNORMAL HIGH (ref 6–20)
CO2: 28 mmol/L (ref 22–29)
Calcium: 9.1 mg/dL (ref 8.6–10.0)
Chloride: 105 mmol/L (ref 98–107)
Creatinine: 0.4 mg/dL — ABNORMAL LOW (ref 0.5–0.9)
GFR African American: 132 mL/min/{1.73_m2} (ref >=90)
GFR Non-African American: 114 mL/min/{1.73_m2} (ref >=90)
Glucose: 101 mg/dL — ABNORMAL HIGH (ref 70–99)
Osmolaliy Calculated: 285 mosm/kg (ref 270–287)
Potassium: 4.7 mmol/L (ref 3.5–5.3)
Sodium: 141 mmol/L (ref 135–145)

## 2018-03-04 LAB — MAGNESIUM: Magnesium: 3 mg/dL — ABNORMAL HIGH (ref 1.6–2.6)

## 2018-03-04 NOTE — Nursing Note (Signed)
Medication Administration Follow Up-Text       Medication Administration Follow Up Entered On:  03/04/2018 1:05 EST    Performed On:  03/04/2018 1:05 EST by DAVIS, RN, [place] M      Intervention Information:     ketorolac  Performed by DAVIS, RN, [place] M on 03/03/2018 23:31:00 EST       ketorolac,15mg   IV Push,Jugular, Internal Left       Med Response   ED Medication Response :   Symptoms improved   DAVIS, RN,  M - 03/04/2018 1:05 EST

## 2018-03-04 NOTE — Nursing Note (Signed)
Medication Administration Follow Up-Text       Medication Administration Follow Up Entered On:  03/04/2018 12:04 EST    Performed On:  03/04/2018 12:04 EST by Ignacia Palmaavidson, RN, Eileen StanfordJenna      Intervention Information:     ketorolac  Performed by Ignacia Palmaavidson, RN, Eileen StanfordJenna on 03/04/2018 11:53:00 EST       ketorolac,15mg   IV Push,Jugular, Internal Right       Med Response   ED Medication Response :   Symptoms improved   Pasero Opioid Induced Sedation Scale :   1 = Awake and alert   Respiratory Rate :   18 br/min   Ignacia Palmaavidson, RN, Eileen StanfordJenna - 03/04/2018 12:04 EST

## 2018-03-04 NOTE — Nursing Note (Signed)
Medication Administration Follow Up-Text       Medication Administration Follow Up Entered On:  03/04/2018 6:40 EST    Performed On:  03/04/2018 6:40 EST by DAVIS, RN, [place] M      Intervention Information:     ketorolac  Performed by DAVIS, RN, [place] M on 03/04/2018 06:12:00 EST       ketorolac,15mg   IV Push,Jugular, Internal Left       Med Response   ED Medication Response :   Symptoms improved   DAVIS, RN,  M - 03/04/2018 6:40 EST

## 2018-03-04 NOTE — Nursing Note (Signed)
Nursing Discharge Summary - Text       Physician Discharge Summary Entered On:  03/04/2018 14:37 EST    Performed On:  03/04/2018 14:37 EST by Tobie PoetSHERROD-PA,  Dorrell Mitcheltree L               DC Information   Provider Instructions for Diet :   A Healthy Diet   Provider Instructions for Activity :   May shower, No Baths/Hot Tubs/Oceans/ or Pools, You may drive if comfortable and not taking pain medication   Provider Instructions for Wound Care :   May shower, No powders ointments or creams to wound area, No tub bath, Report signs/symptoms of infection such as fever, swelling, heat, drainage, redness to surgeon, Other: Chest tube and/or drain sites: Remove gauze dressing(s) prior to shower. Replace if fresh drainage noted. Once dry (no drainage), leave bandage(s) off.   Tobie PoetSHERROD-PA,  Lorian Yaun L - 03/04/2018 14:37 EST

## 2018-03-04 NOTE — Nursing Note (Signed)
Medication Administration Follow Up-Text       Medication Administration Follow Up Entered On:  03/04/2018 23:14 EST    Performed On:  03/04/2018 23:29 EST by Caralee AtesAndrews, RN, Teresita MaduraWilliam T      Intervention Information:     ketorolac  Performed by Caralee AtesAndrews, RN, Teresita MaduraWilliam T on 03/04/2018 23:14:00 EST       ketorolac,15mg   IV Push,Antecubital, Right       Med Response   ED Medication Response :   No adverse reaction, Symptoms improved   Donnetta Simpersndrews, RN, William T - 03/04/2018 23:14 EST

## 2018-03-04 NOTE — Nursing Note (Signed)
Medication Administration Follow Up-Text       Medication Administration Follow Up Entered On:  03/04/2018 11:21 EST    Performed On:  03/04/2018 11:21 EST by Ignacia Palmaavidson, RN, Eileen StanfordJenna      Intervention Information:     tramadol  Performed by Ignacia Palmaavidson, RN, Eileen StanfordJenna on 03/04/2018 10:08:00 EST       tramadol,100mg   Oral,mild pain (1-3)       Med Response   ED Medication Response :   Symptoms improved   Numeric Rating Pain Scale :   2   Pasero Opioid Induced Sedation Scale :   1 = Awake and alert   Respiratory Rate :   18 br/min   Ignacia Palmaavidson, RN, Eileen StanfordJenna - 03/04/2018 11:21 EST

## 2018-03-04 NOTE — Nursing Note (Signed)
Medication Administration Follow Up-Text       Medication Administration Follow Up Entered On:  03/04/2018 6:39 EST    Performed On:  03/04/2018 6:39 EST by DAVIS, RN, [place] M      Intervention Information:     tramadol  Performed by DAVIS, RN, [place] M on 03/04/2018 03:58:00 EST       tramadol,100mg   Oral,mild pain (1-3)       Med Response   ED Medication Response :   Symptoms improved   DAVIS, RN, [place] M - 03/04/2018 6:39 EST

## 2018-03-04 NOTE — Op Note (Signed)
Operative Report    [facility]  Wanda MessierElizabeth M. Elcie Pelster, MD  Service Date: 03/02/2018    PREOPERATIVE DIAGNOSIS:  PET avid left lower lobe mass in a patient  high risk for lung cancer.    POSTOPERATIVE DIAGNOSIS:  Adenocarcinoma, left lower lobe.    OPERATIVE PROCEDURES:  1.  Robotic left lower lobe wedge resection.  2.  Robotic completion left lower lobectomy.  3.  Mediastinal lymph node dissection.  4.  Bronchoscopy.    SURGEON:  Dr. Melven SartoriusElizabeth Azriella Larson.    FIRST ASSISTANT:  Wanda DesanctisAdam Mace, MD.    SECOND ASSISTANT:  Wanda Larson, SA-C.    ANESTHESIA:  General anesthetic with double-lumen endotracheal tube.    ANESTHESIOLOGIST:  Dr. Nancee LiterMatthew Larson.    INDICATIONS:  Wanda FewLori Larson is a 57 year old with tobacco exposure who  has a PET avid left lung mass.  She is consented for surgical wedge  resection and definitive management thereafter.    OPERATIVE FINDINGS:  1.  Adenocarcinoma per Dr. Thea SilversmithAnne Larson at frozen section.  2.  No endobronchial mass and proper positioning of the endotracheal  tube per bronchoscopy.  3.  Mediastinal node dissection from levels 5, 7, 8, 9L, 10L and 11L.  4.  Left lower lobe wedge resection sterilely bisected in the OR and  both pieces forwarded sterilely to pathology.  5.  Left lower lobe completion lobectomy forwarded for permanent  sectioning.    OPERATION IN DETAIL:  With the patient supine on the operating table,  she was inducted into general endotracheal intubation anesthesia via  dual-lumen endotracheal tube.  Antibiotic was delivered intravenously.   Sequential compression devices were placed on the legs and engaged  prior to transfer to the operating room.  Foley catheter was inserted.   This was removed prior to leaving the operating room.  The patient  was given oral multimodality nonnarcotic pain medications prior to  induction.  Anesthesia placed both central venous and arterial lines.   Bronchoscopy was carried out with the findings as above.    The patient was then made in the right  lateral decubitus position,  appropriately padded, and prepped and draped in the usual sterile  fashion.  Local anesthetic was infiltrated as rib blocks prior to each  incision.  The first incision was at the midaxillary line at the 8th  intercostal space.  This was for a long 12 mm trocar.  Insufflation  was carried out to 8 cm of water and under direct visualization the  remaining 4 trocars were placed in the usual fashion.  The Si robot  was docked with the findings as above.  The mass lesion in the left  lower lobe was visible by palpating the surface of the left lower  lobe.  The mass was removed with multiple passes of the thick-tissue  stapler.  The specimen was placed into a retrieval sac and brought out  through the assistant trocar site.  It was bisected on the back table  and both pieces sent sterilely to pathology.  Frozen section diagnosis  showed malignancy and with this, we undertook a completion left lower  lobectomy.  The inferior pulmonary ligament was divided up to the  level of the inferior pulmonary vein.  The inferior pulmonary vein was  circumferentially dissected and divided with a single firing of the  endovascular stapler.  In the course of this dissection and onwards  throughout the procedure, both mediastinum and intrapulmonary nodes  were dissected and forwarded for permanent sectioning.  The fissure  was dissected and the intrafissural artery was identified,  circumferentially dissected and divided with a single firing of the  endovascular stapler, the branch to the left lower lobe.  The  posterior aspect of the fissure as well as the anterior were both  completed with separate firings of the thick tissue stapler.  The lung  was then pedunculated on the airway.  Lymph nodes were harvested from  the secondary carina and forwarded for permanent sectioning and the  airway was divided with a single application of the thick tissue  stapler.  The specimen was placed into a retrieval sac and  brought out  through the assistant trocar site, which had been elongated for this  purpose.  The remainder of the mediastinal node dissection was carried  out with the findings as above.  The rib blocks were placed from the  3rd rib down to the level of the diaphragm.  The chest was filled with  warmed water and the airway pressure was slowly brought to 25 cm.   There was no evidence of leakage from the divided airway stump.  Two  chest drains were then placed, both 24-French Blake, one to the apex  and a second posterobasilar, both anchored with silk sutures.  The  instruments were withdrawn under direct visualization.  Hemostasis was  achieved at all of the operated sites.  The wounds were closed in  layers with absorbable suture.  Dermabond was applied to the skin  edges.  The patient was made supine, extubated and transported to the  PACU in stable condition.  Estimated blood loss was 11 mL.  Urine  output was 200 mL.  Surgical specimens are as above.      Wanda Messier, MD  TR: tn DD: 03/04/2018 16:48 TD: 03/04/2018 17:05 Job#: [id number]  \\[id number]\\DOC#: [id number]  \\[id number]\\      cc:  Garlon Hatchet MD       Venia Carbon ACNP  Signature Line    Electronically Signed on 03/05/2018 05:47 AM EST  ________________________________________________  Hale Drone

## 2018-03-04 NOTE — Nursing Note (Signed)
Medication Administration Follow Up-Text       Medication Administration Follow Up Entered On:  03/04/2018 19:55 EST    Performed On:  03/04/2018 18:34 EST by Caralee AtesAndrews, RN, Teresita MaduraWilliam T      Intervention Information:     ketorolac  Performed by Ignacia Palmaavidson, RN, Eileen StanfordJenna on 03/04/2018 18:19:00 EST       ketorolac,15mg   IV Push,Jugular, Internal Left       Med Response   ED Medication Response :   No adverse reaction   Donnetta SimpersAndrews, RN, [place] T - 03/04/2018 19:55 EST

## 2018-03-04 NOTE — Case Communication (Signed)
CM Discharge Planning Assessment - Text       CM Discharge Planning Ongoing Assessment Entered On:  03/04/2018 11:32 EST    Performed On:  03/04/2018 11:32 EST by Gregary CromerLee,  Jennifer M               Discharge Needs I   Previously Documented Discharge Needs :   DISCHARGE PLAN/NEEDS:  Anticipated Discharge Date: 03/04/2018 - Gregary CromerLee,  Jennifer M - 03/03/18 09:21:00  Discharge To, Anticipated: Home with family support - Gregary CromerLee,  Jennifer M - 03/03/18 09:21:00  Needs Assistance with Transportation: No - Gregary CromerLee,  Jennifer M - 03/03/18 09:21:00  EQUIPMENT/TREATMENT NEEDS:  Needs Assistance at Home Upon Discharge: Yes - Gregary CromerLee,  Jennifer M - 03/03/18 09:21:00       Previously Documented Benefits Information :   Performed By: Gregary CromerLee,  Jennifer M  - 03/03/18 09:21:00       Anticipated Discharge Date :   03/05/2018 EST   Anticipated Discharge Time Slot :   1400-1600   Discharge To :   Home with family support   Home Caregiver Phone Number :   Alveria ApleyStephanie Jones/dtr [phone]   CM Progress Note :   03/03/18 jml: Pt is a 57 y/o female 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 She [place]. She is disabled, but can drive and has no DME. Her dtr [place] dtr's family [place] and pt will stay w/them during recovery. She has [employer] Medicare. I presented the IM letter and she signed it; she was given a copy. She anticipates d/c Wednesday or Thursday. CM following for needs.  03/04/18 jml: Pt's chest tube to water seal. She may d/c home tomorrow. CM following for needs.     Gregary CromerLee,  Jennifer M - 03/04/2018 11:32 EST   Discharge Needs II   Needs Assistance with Transportation :   No   Discharge Transportation Arranged :   N/A   Needs Assistance at Home Upon Discharge :   Yes   Discharge Planning Time Spent :   5 minutes   Gregary CromerLee,  Jennifer M - 03/04/2018 11:32 EST

## 2018-03-04 NOTE — Nursing Note (Signed)
Medication Administration Follow Up-Text       Medication Administration Follow Up Entered On:  03/04/2018 17:43 EST    Performed On:  03/04/2018 17:43 EST by Ignacia Palmaavidson, RN, Eileen StanfordJenna      Intervention Information:     tramadol  Performed by Ignacia Palmaavidson, RN, Eileen StanfordJenna on 03/04/2018 16:16:00 EST       tramadol,100mg   Oral,mild pain (1-3)       Med Response   ED Medication Response :   Symptoms improved   Numeric Rating Pain Scale :   0 = No pain   Pasero Opioid Induced Sedation Scale :   1 = Awake and alert   Respiratory Rate :   18 br/min   Ignacia Palmaavidson, RN, Eileen StanfordJenna - 03/04/2018 17:43 EST

## 2018-03-05 LAB — BASIC METABOLIC PANEL
Anion Gap: 11 mmol/L (ref 2–17)
BUN: 27 mg/dL — ABNORMAL HIGH (ref 6–20)
CO2: 28 mmol/L (ref 22–29)
Calcium: 9.1 mg/dL (ref 8.6–10.0)
Chloride: 102 mmol/L (ref 98–107)
Creatinine: 0.5 mg/dL (ref 0.5–0.9)
GFR African American: 123 mL/min/{1.73_m2} (ref >=90)
GFR Non-African American: 106 mL/min/{1.73_m2} (ref >=90)
Glucose: 150 mg/dL — ABNORMAL HIGH (ref 70–99)
Osmolaliy Calculated: 289 mosm/kg — ABNORMAL HIGH (ref 270–287)
Potassium: 5.2 mmol/L (ref 3.5–5.3)
Sodium: 141 mmol/L (ref 135–145)

## 2018-03-05 LAB — MAGNESIUM: Magnesium: 1.9 mg/dL (ref 1.6–2.6)

## 2018-03-05 NOTE — Nursing Note (Signed)
Medication Administration Follow Up-Text       Medication Administration Follow Up Entered On:  03/05/2018 4:32 EST    Performed On:  03/05/2018 4:23 EST by Caralee AtesAndrews, RN, Teresita MaduraWilliam T      Intervention Information:     morphine  Performed by Caralee AtesAndrews, RN, Teresita MaduraWilliam T on 03/05/2018 04:08:00 EST       morphine,4mg   IV Push,Antecubital, Left,severe pain (8-10)       Med Response   ED Medication Response :   No adverse reaction, Symptoms improved   Donnetta Simpersndrews, RN, William T - 03/05/2018 4:32 EST

## 2018-03-05 NOTE — Discharge Summary (Signed)
 Inpatient Clinical Summary             [facility]  Post-Acute Care Transfer Instructions  PERSON INFORMATION   Name: Wanda Larson, Wanda Larson   MRN: [id number]    FIN#: [id number]   PHYSICIANS  Admitting Physician: DARRIN ALMARIE HERO  Attending Physician: DARRIN ALMARIE HERO   PCP: DARRIN ALMARIE HERO  Discharge Diagnosis: Trapped lung  Comment:       PATIENT EDUCATION INFORMATION  Instructions:             THORACIC SURGERY, Discharge Instructions including pain meds  [place]) [phone]); VTE prophylaxis at [facility]) [phone])  Medication Leaflets:               Follow-up:                           With: Address: When:   TENNIE DONALD CROME [address], [place] [address]  [phone] In 1 week   Comments:   Call for followup appointment in 1 week. Please have x-ray at outpatient radiology at [facility] at least one hour prior to appointment. Appointment is on the 3rd floor of the [facility] on the same campus.                              MEDICATION LIST  Medication Reconciliation at Discharge:          New Medications  Arloa Prior [facility], [address] [place], [place] [id number], [phone]  celecoxib (CeleBREX 200 mg oral capsule) 1 Capsules Oral (given by mouth) 2 times a day. Refills: 0., SOUND ALIKE / LOOK ALIKE - VERIFY DRUG  Last Dose:____________________  enoxaparin (Lovenox 40 mg/0.4 mL injectable solution) 0.4 Milliliter Subcutaneous (under the skin) every day for 10 Days. please dispense 10 syringes. Refills: 0.  Last Dose:____________________  magnesium oxide (magnesium oxide 400 mg (241.3 mg elemental magnesium) oral tablet) 1 Tabs Oral (given by mouth) 2 times a day for 7 Days. Refills: 0.  Last Dose:____________________  Printed Prescriptions  traMADol (traMADol 50 mg oral tablet) 1 Tabs Oral (given by mouth) every 6 hours as needed mild pain (1-3). Refills: 0.  Last Dose:____________________  Other  Medications  acetaminophen  (Tylenol ) 1,000 Milligram Oral (given by mouth) every 8 hours (interval). not to exceed 4000 mg/day from all sources.  Last Dose:____________________  docusate 100 Milligram Oral (given by mouth) 2 times a day as needed as needed for constipation. available over the counter.  Last Dose:____________________  lidocaine topical (Lidoderm 5% topical film) 1 Patches Topical (on the skin) every 24 hours. available over the counter at 4% lidocaine patch;    remove patches after 12 hours.  Last Dose:____________________  Medications That Have Not Changed  Other Medications  acetaminophen -oxyCODONE (oxyCODONE-acetaminophen  10 mg-325 mg oral tablet range dose) 0.5 Tabs Oral (given by mouth) 3 times a day as needed for moderate pain., MAX DAILY DOSE OF ACETAMINOPHEN  = 4000 MG  Last Dose:____________________  ergocalciferol (Vitamin D 1000 intl units oral tablet) 3 Tabs Oral (given by mouth) every day.  Last Dose:____________________  gabapentin (gabapentin 400 mg oral capsule) 1,200 Milligram Oral (given by mouth) once a day (in the evening).  Last Dose:____________________  gabapentin (gabapentin 400 mg oral capsule) 800 Milligram Oral (given by mouth) once a day (in the morning).  Last  Dose:____________________  omeprazole (omeprazole 40 mg oral delayed release capsule) 1 Capsules Oral (given by mouth) every day.  Last Dose:____________________  varenicline (Chantix Continuing Month 1 mg oral tablet) 1 Tabs Oral (given by mouth) 2 times a day.  Last Dose:____________________  These Medications Were Removed and Should No Longer Be Taken  amLODIPine (amLODIPine 5 mg oral tablet) 1 Tabs Oral (given by mouth) once a day (in the evening).  Stop Taking Reason: Physician Request  atenolol (atenolol 50 mg oral tablet) 1 Tabs Oral (given by mouth) once a day (in the evening).  Stop Taking Reason: Physician Request         Patient's Final Home Medication List Upon Discharge:           acetaminophen  (Tylenol )  1,000 Milligram Oral (given by mouth) every 8 hours (interval). not to exceed 4000 mg/day from all sources.  acetaminophen -oxyCODONE (oxyCODONE-acetaminophen  10 mg-325 mg oral tablet range dose) 0.5 Tabs Oral (given by mouth) 3 times a day as needed for moderate pain., MAX DAILY DOSE OF ACETAMINOPHEN  = 4000 MG  celecoxib (CeleBREX 200 mg oral capsule) 1 Capsules Oral (given by mouth) 2 times a day. Refills: 0., SOUND ALIKE / LOOK ALIKE - VERIFY DRUG  docusate 100 Milligram Oral (given by mouth) 2 times a day as needed as needed for constipation. available over the counter.  enoxaparin (Lovenox 40 mg/0.4 mL injectable solution) 0.4 Milliliter Subcutaneous (under the skin) every day for 10 Days. please dispense 10 syringes. Refills: 0.  ergocalciferol (Vitamin D 1000 intl units oral tablet) 3 Tabs Oral (given by mouth) every day.  gabapentin (gabapentin 400 mg oral capsule) 1,200 Milligram Oral (given by mouth) once a day (in the evening).  gabapentin (gabapentin 400 mg oral capsule) 800 Milligram Oral (given by mouth) once a day (in the morning).  lidocaine topical (Lidoderm 5% topical film) 1 Patches Topical (on the skin) every 24 hours. available over the counter at 4% lidocaine patch;    remove patches after 12 hours.  magnesium oxide (magnesium oxide 400 mg (241.3 mg elemental magnesium) oral tablet) 1 Tabs Oral (given by mouth) 2 times a day for 7 Days. Refills: 0.  omeprazole (omeprazole 40 mg oral delayed release capsule) 1 Capsules Oral (given by mouth) every day.  traMADol (traMADol 50 mg oral tablet) 1 Tabs Oral (given by mouth) every 6 hours as needed mild pain (1-3). Refills: 0.  varenicline (Chantix Continuing Month 1 mg oral tablet) 1 Tabs Oral (given by mouth) 2 times a day.         Comment:       ORDERS          Order Name Order Details   Discharge Patient 03/05/18 8:07:00 EST, Discharge Home/Self Care

## 2018-03-05 NOTE — Nursing Note (Signed)
Medication Administration Follow Up-Text       Medication Administration Follow Up Entered On:  03/05/2018 5:13 EST    Performed On:  03/05/2018 5:25 EST by Caralee AtesAndrews, RN, Teresita MaduraWilliam T      Intervention Information:     ketorolac  Performed by Caralee AtesAndrews, RN, Teresita MaduraWilliam T on 03/05/2018 05:10:00 EST       ketorolac,15mg   IV Push,Antecubital, Right       Med Response   ED Medication Response :   No adverse reaction, Symptoms improved   Donnetta SimpersAndrews, RN, William T - 03/05/2018 5:13 EST

## 2018-03-05 NOTE — Nursing Note (Signed)
Medication Administration Follow Up-Text       Medication Administration Follow Up Entered On:  03/05/2018 1:33 EST    Performed On:  03/05/2018 2:04 EST by Caralee AtesAndrews, RN, Teresita MaduraWilliam T      Intervention Information:     hydromorphone  Performed by Caralee AtesAndrews, RN, Teresita MaduraWilliam T on 03/05/2018 01:04:00 EST       hydromorphone,2mg   Oral,moderate pain (4-7)       Med Response   ED Medication Response :   No adverse reaction   Royanne FootsAndrews, RN, William T - 03/05/2018 1:32 EST

## 2018-03-05 NOTE — Discharge Summary (Signed)
Inpatient Patient Summary               [facility]  [address]  [place], [place] [address]  [phone]  Patient Discharge Instructions    Name: Wanda Larson, Wanda Larson  Current Date: 03/05/2018 09:49:42  DOB: 04-Jul-1960 MRN: [id number] FIN: [phone]  Patient Address: [address] [place] [address]  Patient Phone: [phone]  Primary Care Provider:  Name: Hale Drone  Phone: [phone]   Immunizations Provided:      Discharge Diagnosis: Trapped lung  Discharged To: TO, ANTICIPATED%>Home with family support  Home Treatments: TREATMENTS, ANTICIPATED%>  Devices/Equipment: EQUIPMENT REHAB%>None  Post Hospital Services: HOSPITAL SERVICES%>  Professional Skilled Services: SKILLED SERVICES%>  Special Services and Community Resources: SERV AND COMM RES, ANTICIPATED%>  Mode of Discharge Transportation: TRANSPORTATION%>  Discharge Orders          Discharge Patient 03/05/18 8:07:00 EST, Discharge Home/Self Care        Comment:     Medications   During the course of your visit, your medication list was updated with the most current information. The details of those changes are reflected below:          New Medications  Karin Golden [facility], [address] [place], [place] [id number], [phone]  celecoxib (CeleBREX 200 mg oral capsule) 1 Capsules Oral (given by mouth) 2 times a day. Refills: 0., SOUND ALIKE / LOOK ALIKE - VERIFY DRUG  Last Dose:____________________  enoxaparin (Lovenox 40 mg/0.4 mL injectable solution) 0.4 Milliliter Subcutaneous (under the skin) every day for 10 Days. please dispense 10 syringes. Refills: 0.  Last Dose:____________________  magnesium oxide (magnesium oxide 400 mg (241.3 mg elemental magnesium) oral tablet) 1 Tabs Oral (given by mouth) 2 times a day for 7 Days. Refills: 0.  Last Dose:____________________  Printed Prescriptions  traMADol (traMADol 50 mg oral tablet) 1 Tabs Oral (given by mouth) every 6 hours as needed mild pain (1-3).  Refills: 0.  Last Dose:____________________  Other Medications  acetaminophen (Tylenol) 1,000 Milligram Oral (given by mouth) every 8 hours (interval). not to exceed 4000 mg/day from all sources.  Last Dose:____________________  docusate 100 Milligram Oral (given by mouth) 2 times a day as needed as needed for constipation. available over the counter.  Last Dose:____________________  lidocaine topical (Lidoderm 5% topical film) 1 Patches Topical (on the skin) every 24 hours. available over the counter at 4% lidocaine patch;    remove patches after 12 hours.  Last Dose:____________________  Medications That Have Not Changed  Other Medications  acetaminophen-oxyCODONE (oxyCODONE-acetaminophen 10 mg-325 mg oral tablet range dose) 0.5 Tabs Oral (given by mouth) 3 times a day as needed for moderate pain., MAX DAILY DOSE OF ACETAMINOPHEN = 4000 MG  Last Dose:____________________  ergocalciferol (Vitamin D 1000 intl units oral tablet) 3 Tabs Oral (given by mouth) every day.  Last Dose:____________________  gabapentin (gabapentin 400 mg oral capsule) 1,200 Milligram Oral (given by mouth) once a day (in the evening).  Last Dose:____________________  gabapentin (gabapentin 400 mg oral capsule) 800 Milligram Oral (given by mouth) once a day (in the morning).  Last Dose:____________________  omeprazole (omeprazole 40 mg oral delayed release capsule) 1 Capsules Oral (given by mouth) every day.  Last Dose:____________________  varenicline (Chantix Continuing Month 1 mg oral tablet) 1 Tabs Oral (given by mouth) 2 times a day.  Last Dose:____________________  These Medications Were Removed and Should No Longer Be Taken  amLODIPine (amLODIPine 5  mg oral tablet) 1 Tabs Oral (given by mouth) once a day (in the evening).  Stop Taking Reason: Physician Request  atenolol (atenolol 50 mg oral tablet) 1 Tabs Oral (given by mouth) once a day (in the evening).  Stop Taking Reason: Physician Request        [facility] would like to thank  you for allowing [place] to assist you with your healthcare needs. The following includes patient education materials and information regarding your injury/illness.    Wanda Larson has been given the following list of follow-up instructions, prescriptions, and patient education materials:  Follow-up Instructions:              With: Address: When:   Tobie Poet [address], [place] [address]  [phone] In 1 week   Comments:   Call for followup appointment in 1 week. Please have x-ray at outpatient radiology at [facility] at least one hour prior to appointment. Appointment is on the 3rd floor of the [facility] on the same campus.                    It is important to always keep an active list of medications available so that you can share with other providers and manage your medications appropriately. As an additional courtesy, we are also providing you with your final active medications list that you can keep with you.           acetaminophen (Tylenol) 1,000 Milligram Oral (given by mouth) every 8 hours (interval). not to exceed 4000 mg/day from all sources.  acetaminophen-oxyCODONE (oxyCODONE-acetaminophen 10 mg-325 mg oral tablet range dose) 0.5 Tabs Oral (given by mouth) 3 times a day as needed for moderate pain., MAX DAILY DOSE OF ACETAMINOPHEN = 4000 MG  celecoxib (CeleBREX 200 mg oral capsule) 1 Capsules Oral (given by mouth) 2 times a day. Refills: 0., SOUND ALIKE / LOOK ALIKE - VERIFY DRUG  docusate 100 Milligram Oral (given by mouth) 2 times a day as needed as needed for constipation. available over the counter.  enoxaparin (Lovenox 40 mg/0.4 mL injectable solution) 0.4 Milliliter Subcutaneous (under the skin) every day for 10 Days. please dispense 10 syringes. Refills: 0.  ergocalciferol (Vitamin D 1000 intl units oral tablet) 3 Tabs Oral (given by mouth) every day.  gabapentin (gabapentin 400 mg oral capsule) 1,200 Milligram Oral (given by  mouth) once a day (in the evening).  gabapentin (gabapentin 400 mg oral capsule) 800 Milligram Oral (given by mouth) once a day (in the morning).  lidocaine topical (Lidoderm 5% topical film) 1 Patches Topical (on the skin) every 24 hours. available over the counter at 4% lidocaine patch;    remove patches after 12 hours.  magnesium oxide (magnesium oxide 400 mg (241.3 mg elemental magnesium) oral tablet) 1 Tabs Oral (given by mouth) 2 times a day for 7 Days. Refills: 0.  omeprazole (omeprazole 40 mg oral delayed release capsule) 1 Capsules Oral (given by mouth) every day.  traMADol (traMADol 50 mg oral tablet) 1 Tabs Oral (given by mouth) every 6 hours as needed mild pain (1-3). Refills: 0.  varenicline (Chantix Continuing Month 1 mg oral tablet) 1 Tabs Oral (given by mouth) 2 times a day.      Take only the medications listed above. Contact your doctor prior to taking any medications not on this list.      Discharge instructions, if any, will display below  Instructions for Diet: INSTRUCTIONS FOR DIET%>A Healthy Diet   Instructions for Supplements: SUPPLEMENT INSTRUCTIONS%>   Instructions for Activity: INSTRUCTIONS FOR ACTIVITY%>May shower, No Baths/Hot Tubs/Oceans/ or Pools,  You may drive if comfortable and not taking pain medication   Instructions for Wound Care: INSTRUCTIONS FOR WOUND CARE%>May shower, No powders ointments or creams to wound area, No tub bath, Report signs/symptoms of infection such as fever, swelling,, Other: Chest tube and/or drain sites: Remove gauze dressing(s) prior to shower. Replace if fresh drainage noted. Once dr...    Medication leaflets, if any, will display below     Patient education materials, if any, will display below               Discharge Instructions for Thoracic Surgery  When to seek medical care  Call your health care provider, or 911 for severe symptoms, if you have:   Fever   Increased coughing or coughing up brown or bloody mucus   Increased redness, swelling, or  pain near your incision, or drainage from your incision   Nausea or vomiting   Shortness of breath   Swelling in one or both legs   Feeling like your heart is beating too fast (palpitations)  Incision care   Make sure you and your caregiver follow all instructions for caring for your surgical site.   Keep incisions clean and dry. May shower. No tubs baths.   Chest tube and/or drain sites: Remove gauze dressing(s) prior to shower. Replace if fresh drainage noted. Once dry (no drainage), leave bandage(s) off.   No creams or ointments on incisions.  Activity   You will notice that you get tired more easily. This is normal because your lungs and your heart have to work harder. Rest when you are tired.   Walk Daily. Increase distance as tolerated.   You may drive once you feel comfortable doing so. Do not drive while taking narcotic pain medication.  Other home care   You will continue to take medications to help lessen the pain. For the first few weeks after surgery you will continue to take Tylenol three times a day and Celebrex twice daily as instructed. You were given a prescription for a narcotic pain medication to take as needed for breakthrough pain.    Keep in mind that pain medications often cause constipation.   Use laxatives, stool softeners, or enemas as directed by your health care provider.   Drink water throughout the day, unless instructed to limit fluids.   Eat high-fiber foods, like whole grains, and fruits and vegetables.   Use your incentive spirometer as instructed.   Return to your diet as you feel able. Eat a healthy, well-balanced diet.   Having a healthy diet helps you heal. Make sure you have lean meats, low- or no-fat dairy products, whole grains, and fruits and vegetables.   Do not smoke and stay away from people who do. If you do smoke, talk to your health care provider about ways to quit.  Follow-up care  Make a follow-up appointment with your health care providers as  directed by our staff. And, call your health care provider if you have any concerns before your appointment.     [address] [employer] [address] [place], [place] [address]. All rights reserved. This information is not intended as a substitute for professional medical care. Always follow your healthcare professional's instructions.  PREVENTING BLOOD CLOTS AFTER SURGERY  While you were in the hospital you wore compression boots and received blood thinning shots to prevent blood clots in your legs (deep vein thrombosis or DVT) and lungs (pulmonary embolism or PE). Before you are discharged your risk for developing a blood clot was assessed and determined to be either moderate or high. As a result you will need to continue blood thinning shots for a length of time (10 days for moderate risk  or 30 days for high risk).   Lovenox (enoxaparin) is a blood thinning shot (injection). You or someone caring for you will inject it once a day.  Instructions for giving Lovenox injections are as follows:  1.Wash and dry your hands thoroughly.  2. it or lie in a comfortable position, so that you can see your abdomen.  3. Choose an area on the right or left side of your abdomen, at least 2 inches from your belly button. Think "love handles." Change the site each time you inject the medicine.   4. Clean the injection site with alcohol swabs. Let dry.  5. Remove needed cap by pulling it straight off the syringe and discard in sharps collector.  6. Hold syringe like a pencil in your writing hand.  7. With other hand, pinch and inch of the cleansed area to make a fold in the skin. Insert full length of needed straight down - at a 90 angle - into the fold of skin.  8. Press plunger with your thumb until syringe is empty.  9. Pull needle straight out at the same angle that it was inserted, and release skin fold.  10. Point needed down and away from yourself and others, and push down on plunger to  activate safety shield.  11. Place used syringe in sharps collector.    What if I miss a dose?  If you miss a dose, take it as soon as you can. If it is almost time for your next dose, take only that dose. Do not take double or extra doses.    Call your doctor right away if you notice any of the following:   Bleeding or oozing from surgical wound   Any other bleeding episodes; for example, bleeding at the site of the injection, nosebleeds, blood in your urine, or if you cough or vomit blood or have bloody or black, tarry stools   Spontaneous bruising (a bruise not caused by a trauma)   Pain or swelling in your legs            IS IT A STROKE? Act FAST and Check for these signs:    FACE                         Does the face look uneven?    ARM                         Does one arm drift down?    SPEECH                    Does their speech sound strange?    TIME                         Call 9-1-1 at any sign of stroke  Heart Attack Signs  Chest discomfort: Most heart attacks involve discomfort in the center of the chest and lasts  more than a few minutes, or goes away and comes back. It can feel like uncomfortable pressure, squeezing, fullness or pain.  Discomfort in upper body: Symptoms can include pain or discomfort in one or both arms, back, neck, jaw or stomach.  Shortness of breath: With or without discomfort.  Other signs: Breaking out in a cold sweat, nausea, or lightheaded.  Remember, MINUTES DO MATTER. If you experience any of these heart attack warning signs, call 9-1-1 to get immediate medical attention!     ---------------------------------------------------------------------------------------------------------------------  [facility] allows you to manage your health, view your test results, and retrieve your discharge documents from your hospital stay securely and conveniently from your computer.  To begin the enrollment process, visit [email or web address] Click on "Sign up now" under [facility].

## 2018-03-05 NOTE — Case Communication (Signed)
CM Discharge Planning Assessment - Text       CM Discharge Planning Ongoing Assessment Entered On:  03/05/2018 10:33 EST    Performed On:  03/05/2018 10:32 EST by Gregary CromerLee,  Jennifer M               Discharge Needs I   Previously Documented Discharge Needs :   DISCHARGE PLAN/NEEDS:  Anticipated Discharge Date: 03/05/2018 - Gregary CromerLee,  Jennifer M - 03/04/18 11:32:00  Discharge To, Anticipated: Home with family support - Gregary CromerLee,  Jennifer M - 03/04/18 11:32:00  Needs Assistance with Transportation: No - Gregary CromerLee,  Jennifer M - 03/04/18 11:32:00  EQUIPMENT/TREATMENT NEEDS:  Needs Assistance at Home Upon Discharge: Yes - Gregary CromerLee,  Jennifer M - 03/04/18 11:32:00       Previously Documented Benefits Information :   Performed By: Gregary CromerLee,  Jennifer M  - 03/03/18 09:21:00       Anticipated Discharge Date :   03/05/2018 EST   Anticipated Discharge Time Slot :   1400-1600   Discharge To :   Home with family support   Home Caregiver Phone Number :   Alveria ApleyStephanie Jones/dtr [phone]   CM Progress Note :   03/03/18 jml: Pt is a 57 y/o female 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 [place] in [place]. She is disabled, but can drive and has no DME. Her dtr [place] dtr's [place] and pt will stay w/them during recovery. She has [employer] Medicare. I presented the IM letter and she signed it; she was given a copy. She anticipates d/c Wednesday or Thursday. CM following for needs.  03/04/18 jml: Pt's chest tube to water seal. She may d/c home tomorrow. CM following for needs.  03/05/18 jml: Pt is d/c'ing home today w/family. No needs identified. IM remains valid.     Gregary CromerLee,  Jennifer M - 03/05/2018 10:32 EST   Discharge Needs II   DischargDischarge Device/Equipment CMe Device/Equipment CM :   None   Professional Skilled Services :   No Needs   Needs Assistance with Transportation :   No   Discharge Transportation Arranged :   N/A   Needs Assistance at Home Upon Discharge :   Yes   Discharge Planning Time Spent :   10  minutes   Gregary CromerLee,  Jennifer M - 03/05/2018 10:32 EST   Discharge Planning   Discharge Arrangements :       Patient Post-Acute Information    Patient Name: Wanda RoyalsLLEN, Sokhna T  MRN: [id number]  FIN: [id number]  Gender: Female  DOB: 26-Dec-2060  Age:  [age] Years        *** No Post-Acute Placement(s) Listed ***          *** No Post-Acute Service(s) Listed ***       Discharge Options Discussed with Patient :   Home Health   Interventions Performed :   All resolved   Discharge Plan Discussion :   Discussed with patient, Patient agrees with plan   Barriers to Discharge Identified :   None identified   Gregary CromerLee,  Jennifer M - 03/05/2018 10:32 EST

## 2019-07-30 NOTE — Nursing Note (Signed)
Adult Admission Assessment - Text       Perioperative Admission Assessment Entered On:  07/30/2019 10:45 EDT    Performed On:  07/30/2019 10:43 EDT by Still, RN, Melody               General   Call Start :   08/04/2019 14:51 EDT   Call Complete :   08/04/2019 15:06 EDT   Information Given By :   Self   Height/Length Estimated :   162.5 cm(Converted to: 5 ft 4 in, 5.33 ft, 63.98 in)    BMI   Estimated :   57.5 kg(Converted to: 126 lb 12 oz, 126.766 lb)    Body Mass Index Estimated :   21.78 kg/m2   Languages :   [place]   Preferred Communication Mode :   Verbal   TUCKER, RN, JILL A - 08/04/2019 14:51 EDT   Primary Care Physician/Specialists :   DR Kennyth Arnold   Emergency Contact Name :   Ivor Reining   Emergency Contact Phone :   [phone]   Still, RN, Melody - 07/30/2019 10:43 EDT   Allergies   (As Of: 08/09/2019 06:27:26 EDT)   Allergies (Active)   erythromycin  Estimated Onset Date:   Unspecified ; Reactions:   HIVES ; Created By:   LAKE, RN, JENIFER L; Reaction Status:   Active ; Category:   Drug ; Substance:   erythromycin ; Type:   Allergy ; Severity:   Moderate ; Updated By:   Raynelle Bring, RN, JENIFER L; Reviewed Date:   08/09/2019 6:24 EDT        Medication History   Medication List   (As Of: 08/09/2019 06:27:26 EDT)   Normal Order    Sodium Chloride 0.9% intravenous solution 500 mL  :   Sodium Chloride 0.9% intravenous solution 500 mL ; Status:   Ordered ; Ordered As Mnemonic:   Sodium Chloride 0.9% 500 mL ; Simple Display Line:   10 mL/hr, IV ; Ordering Provider:   EDWARDS,  ANNE-MD; Catalog Code:   Sodium Chloride 0.9% ; Order Dt/Tm:   07/26/2019 12:49:30 EDT ; Comment:   Perioperative use ONLY  For Dialysis Patient          Lactated Ringers Injection solution 1,000 mL  :   Lactated Ringers Injection solution 1,000 mL ; Status:   Ordered ; Ordered As Mnemonic:   Lactated Ringers Injection 1,000 mL ; Simple Display Line:   10 mL/hr, IV ; Ordering Provider:   EDWARDS,  ANNE-MD; Catalog Code:   Lactated Ringers  Injection ; Order Dt/Tm:   07/26/2019 12:49:30 EDT ; Comment:   Perioperative use ONLY  For Non Dialysis Patient          sodium chloride 0.9% Inj Soln 10 mL syringe  :   sodium chloride 0.9% Inj Soln 10 mL syringe ; Status:   Ordered ; Ordered As Mnemonic:   sodium chloride 0.9% flush syringe range dose ; Simple Display Line:   30 mL, IV Push, q8hr ; Ordering Provider:   EDWARDS,  ANNE-MD; Catalog Code:   sodium chloride flush ; Order Dt/Tm:   08/09/2019 06:13:01 EDT          sodium chloride 0.9% Inj Soln 10 mL syringe  :   sodium chloride 0.9% Inj Soln 10 mL syringe ; Status:   Ordered ; Ordered As Mnemonic:   sodium chloride 0.9% flush syringe range dose ; Simple Display Line:   30 mL, IV Push, q72mn, PRN: other (  see comment) ; Ordering Provider:   Oletta Lamas,  ANNE-MD; Catalog Code:   sodium chloride flush ; Order Dt/Tm:   08/09/2019 06:13:01 EDT          A Patient Specific Refrigerated Medication  :   A Patient Specific Refrigerated Medication ; Status:   Ordered ; Ordered As Mnemonic:   A Patient Specific Refrigerated Medication ; Simple Display Line:   1 EA, Kit-Combo, q10mn, PRN: other (see comment) ; Ordering Provider:   EOletta Lamas  ANNE-MD; Catalog Code:   A Patient Specific Refrigerated Medicati ; Order Dt/Tm:   08/09/2019 06:13:01 EDT ; Comment:   to access the patient specific Refrigerated medications          Respiratory MDI Treatment  :   Respiratory MDI Treatment ; Status:   Ordered ; Ordered As Mnemonic:   Respiratory MDI Treatment ; Simple Display Line:   1 EA, Kit-Combo, q534m, PRN: other (see comment) ; Ordering Provider:   EDDelcie RochCatalog Code:   Respiratory MDI Treatment ; Order Dt/Tm:   08/09/2019 06:13:01 EDT          Delivery and Return Bin Access  :   Delivery and Return [place] ; Status:   Ordered ; Ordered As Mnemonic:   Delivery and Return Bin Access ; Simple Display Line:   1 EA, Kit-Combo, q5m78m PRN: other (see comment) ; Ordering Provider:   EDWDelcie Rochatalog Code:    Delivery and Return Bin Access ; Order Dt/Tm:   08/09/2019 06:13:01 EDT ; Comment:   This code grants access to the [employer] the Delivery and Return [employer]       sterile water Inj Soln 10 mL  :   sterile water Inj Soln 10 mL ; Status:   Ordered ; Ordered As Mnemonic:   sterile water for reconstitution ; Simple Display Line:   10 mL, N/A, q5mi27mPRN: other (see comment) ; Ordering Provider:   EDWAOletta LamasNNE-MD; Catalog Code:   sterile water for reconstitution ; Order Dt/Tm:   08/09/2019 06:13:01 EDT ; Comment:   Access sterile water when needed as a diluent for reconstitutable medications. Not for IV use.          lidocaine 1% PF Inj Soln 2 mL  :   lidocaine 1% PF Inj Soln 2 mL ; Status:   Ordered ; Ordered As Mnemonic:   lidocaine 1% preservative-free injectable solution ; Simple Display Line:   0.25 mL, ID, q5min29mRN: other (see comment) ; Ordering Provider:   EDWAROletta LamasNE-MD; Catalog Code:   lidocaine ; Order Dt/Tm:   08/09/2019 06:13:01 EDT ; Comment:   to access lidocaine 1%  2 mL vial for IV start and [employer] access          A Patient Specific Medication  :   A Patient Specific Medication ; Status:   Ordered ; Ordered As Mnemonic:   A Patient Specific Medication ; Simple Display Line:   1 EA, Kit-Combo, q5min,72mN: other (see comment) ; Ordering Provider:   EDWARDOletta LamasE-MD; Catalog Code:   A Patient Specific Medication ; Order Dt/Tm:   08/09/2019 06:13:01 EDT          sodium chloride 0.9% Inj Soln 10 mL vial PF  :   sodium chloride 0.9% Inj Soln 10 mL vial PF ; Status:   Ordered ; Ordered As Mnemonic:   sodium chloride 0.9% vial for reconstitution range  dose ; Simple Display Line:   30 mL, IV Push, q23mn, PRN: other (see comment) ; Ordering Provider:   EOletta Lamas  ANNE-MD; Catalog Code:   sodium chloride flush ; Order Dt/Tm:   08/09/2019 06:13:01 EDT ; Comment:   for access to sodium chloride 0.9% vial when needed as a diluent for reconstitutable medications          ceFAZolin  duplex  :   ceFAZolin duplex ; Status:   Ordered ; Ordered As Mnemonic:   ceFAZolin IVPB ; Simple Display Line:   2 g, 50 mL, 100 mL/hr, IV Piggyback, On Call ; Ordering Provider:   EDWARDS,  ANNE-MD; Catalog Code:   ceFAZolin ; Order Dt/Tm:   07/26/2019 12:49:30 EDT ; Comment:   wt < 120Kg/264lb            Prescription/Discharge Order    traMADol  :   traMADol ; Status:   Prescribed ; Ordered As Mnemonic:   traMADol 50 mg oral tablet ; Simple Display Line:   50 mg, 1 tabs, Oral, q6hr, PRN: mild pain (1-3), 5 tabs, 0 Refill(s) ; Ordering Provider:   SJonette Eva Catalog Code:   traMADol ; Order Dt/Tm:   03/05/2018 07:20:57 EST          magnesium oxide  :   magnesium oxide ; Status:   Prescribed ; Ordered As Mnemonic:   magnesium oxide 400 mg (241.3 mg elemental magnesium) oral tablet ; Simple Display Line:   400 mg, 1 tabs, Oral, BID, for 7 days, 14 tabs, 0 Refill(s) ; Ordering Provider:   SJonette Eva Catalog Code:   magnesium oxide ; Order Dt/Tm:   03/04/2018 14:35:14 EST          celecoxib  :   celecoxib ; Status:   Prescribed ; Ordered As Mnemonic:   CeleBREX 200 mg oral capsule ; Simple Display Line:   200 mg, 1 caps, Oral, BID, 60 caps, 0 Refill(s) ; Ordering Provider:   SJonette Eva Catalog Code:   celecoxib ; Order Dt/Tm:   03/04/2018 14:35:51 EST ; Comment:   SOUND ALIKE / LOOK ALIKE - VERIFY DRUG            Home Meds    atenolol  :   atenolol ; Status:   Documented ; Ordered As Mnemonic:   atenolol 50 mg oral tablet ; Simple Display Line:   50 mg, 1 tabs, Oral, Daily, 30 tabs, 0 Refill(s) ; Catalog Code:   atenolol ; Order Dt/Tm:   08/04/2019 14:54:57 EDT          amLODIPine  :   amLODIPine ; Status:   Documented ; Ordered As Mnemonic:   amLODIPine 5 mg oral tablet ; Simple Display Line:   5 mg, 1 tabs, Oral, Daily, 30 tabs, 0 Refill(s) ; Catalog Code:   amLODIPine ; Order Dt/Tm:   08/04/2019 14:54:49 EDT          acetaminophen  :   acetaminophen ; Status:   Documented ; Ordered  As Mnemonic:   Tylenol ; Simple Display Line:   1,000 mg, 2 tabs, Oral, q8hr-INT, not to exceed 4000 mg/day from all sources, 0 Refill(s) ; Ordering Provider:   SJonette Eva Catalog Code:   acetaminophen ; Order Dt/Tm:   03/04/2018 14:33:59 EST          docusate  :   docusate ; Status:   Documented ; Ordered As Mnemonic:  docusate ; Simple Display Line:   100 mg, Oral, BID, available over the counter, PRN: as needed for constipation, 0 Refill(s) ; Ordering Provider:   Jonette Eva; Catalog Code:   docusate ; Order Dt/Tm:   03/04/2018 14:34:16 EST          lidocaine topical  :   lidocaine topical ; Status:   Documented ; Ordered As Mnemonic:   Lidoderm 5% topical film ; Simple Display Line:   1 patches, Topical, q24hr, available over the counter at 4% lidocaine patch;   remove patches after 12 hours, 0 Refill(s) ; Ordering Provider:   Jonette Eva; Catalog Code:   lidocaine topical ; Order Dt/Tm:   03/04/2018 14:34:56 EST          omeprazole  :   omeprazole ; Status:   Documented ; Ordered As Mnemonic:   omeprazole 40 mg oral delayed release capsule ; Simple Display Line:   40 mg, 1 caps, Oral, Daily, 0 Refill(s) ; Catalog Code:   omeprazole ; Order Dt/Tm:   02/26/2018 13:57:36 EST          varenicline  :   varenicline ; Status:   Documented ; Ordered As Mnemonic:   Chantix Continuing Month 1 mg oral tablet ; Simple Display Line:   1 mg, 1 tabs, Oral, BID, 0 Refill(s) ; Ordering Provider:   Riki Sheer; Catalog Code:   varenicline ; Order Dt/Tm:   02/26/2018 13:57:36 EST          gabapentin  :   gabapentin ; Status:   Documented ; Ordered As Mnemonic:   gabapentin 400 mg oral capsule ; Simple Display Line:   800 mg, Oral, qAM, 0 Refill(s) ; Catalog Code:   gabapentin ; Order Dt/Tm:   02/26/2018 13:57:36 EST          ergocalciferol  :   ergocalciferol ; Status:   Documented ; Ordered As Mnemonic:   Vitamin D 1000 intl units oral tablet ; Simple Display Line:   3 tabs, Oral, Daily,  0 Refill(s) ; Catalog Code:   ergocalciferol ; Order Dt/Tm:   02/26/2018 13:57:13 EST          gabapentin  :   gabapentin ; Status:   Documented ; Ordered As Mnemonic:   gabapentin 400 mg oral capsule ; Simple Display Line:   1,200 mg, Oral, qPM, 0 Refill(s) ; Catalog Code:   gabapentin ; Order Dt/Tm:   02/26/2018 13:57:36 EST          acetaminophen-oxyCODONE  :   acetaminophen-oxyCODONE ; Status:   Documented ; Ordered As Mnemonic:   oxyCODONE-acetaminophen 10 mg-325 mg oral tablet range dose ; Simple Display Line:   0.5 tabs, Oral, TID, PRN: moderate pain (4-7), 0 Refill(s) ; Catalog Code:   acetaminophen-oxyCODONE ; Order Dt/Tm:   02/26/2018 13:42:26 EST ; Comment:   MAX DAILY DOSE OF ACETAMINOPHEN = 4000 MG            Problem History   (As Of: 08/09/2019 06:27:26 EDT)   Problems(Active)    Chronic back pain (SNOMED CT  :573220254 )  Name of Problem:   Chronic back pain ; Recorder:   LAKE, RN, JENIFER L; Confirmation:   Confirmed ; Classification:   Patient Stated ; Code:   [id number] ; Contributor System:   Conservation officer, nature ; Last Updated:   02/26/2018 13:43 EST ; Life Cycle Date:   02/26/2018 ; Life Cycle Status:   Active ;  Vocabulary:   SNOMED CT        DDD (degenerative disc disease), cervical (SNOMED CT  :627035009 )  Name of Problem:   DDD (degenerative disc disease), cervical ; Recorder:   TUCKER, RN, JILL A; Confirmation:   Confirmed ; Classification:   Patient Stated ; Code:   [id number] ; Contributor System:   Conservation officer, nature ; Last Updated:   08/04/2019 15:03 EDT ; Life Cycle Date:   08/04/2019 ; Life Cycle Status:   Active ; Vocabulary:   SNOMED CT        DDD (degenerative disc disease), lumbar (SNOMED CT  :16967893 )  Name of Problem:   DDD (degenerative disc disease), lumbar ; Recorder:   TUCKER, RN, JILL A; Confirmation:   Confirmed ; Classification:   Patient Stated ; Code:   81017510 ; Contributor System:   PowerChart ; Last Updated:   08/04/2019 15:03 EDT ; Life Cycle Date:   08/04/2019 ; Life Cycle Status:    Active ; Vocabulary:   SNOMED CT        GERD (gastroesophageal reflux disease) (SNOMED CT  :258527782 )  Name of Problem:   GERD (gastroesophageal reflux disease) ; Recorder:   LAKE, RN, JENIFER L; Confirmation:   Confirmed ; Classification:   Patient Stated ; Code:   423536144 ; Contributor System:   PowerChart ; Last Updated:   02/24/2018 11:11 EST ; Life Cycle Date:   02/24/2018 ; Life Cycle Status:   Active ; Vocabulary:   SNOMED CT        HTN (hypertension) (SNOMED CT  :3154008676 )  Name of Problem:   HTN (hypertension) ; Recorder:   LAKE, RN, JENIFER L; Confirmation:   Confirmed ; Classification:   Patient Stated ; Code:   [id number] ; Contributor System:   Conservation officer, nature ; Last Updated:   02/24/2018 11:11 EST ; Life Cycle Date:   02/24/2018 ; Life Cycle Status:   Active ; Vocabulary:   SNOMED CT          Diagnoses(Active)    Acquired blepharochalasis of left eye  Date:   07/22/2019 ; Diagnosis Type:   Pre-Op Diagnosis ; Confirmation:   Confirmed ; Clinical Dx:   Acquired blepharochalasis of left eye ; Classification:   Medical ; Clinical Service:   Non-Specified ; Code:   ICD-10-CM ; Probability:   0 ; Diagnosis Code:   H02.36      Blepharochalasis of right upper eyelid  Date:   07/22/2019 ; Diagnosis Type:   Pre-Op Diagnosis ; Confirmation:   Confirmed ; Clinical Dx:   Blepharochalasis of right upper eyelid ; Classification:   Medical ; Clinical Service:   Non-Specified ; Code:   ICD-10-CM ; Probability:   0 ; Diagnosis Code:   H02.31      Brow ptosis  Date:   07/26/2019 ; Diagnosis Type:   Pre-Op Diagnosis ; Confirmation:   Confirmed ; Clinical Dx:   Brow ptosis ; Classification:   Medical ; Clinical Service:   Non-Specified ; Code:   ICD-10-CM ; Probability:   0 ; Diagnosis Code:   H57.819      Brow ptosis, left  Date:   07/22/2019 ; Diagnosis Type:   Pre-Op Diagnosis ; Confirmation:   Confirmed ; Clinical Dx:   Brow ptosis, left ; Classification:   Medical ; Clinical Service:   Non-Specified ; Code:   ICD-10-CM ;  Probability:   0 ; Diagnosis Code:   I45.809  Procedure History        -    Procedure History   (As Of: 08/09/2019 06:27:26 EDT)     Anesthesia Minutes:   0 ; Procedure Name:   Colonoscopy ; Procedure Minutes:   0 ; Last Reviewed Dt/Tm:   08/09/2019 06:27:02 EDT            Anesthesia Minutes:   0 ; Procedure Name:   Hysterectomy ; Procedure Minutes:   0 ; Last Reviewed Dt/Tm:   08/09/2019 06:27:02 EDT            Procedure Dt/Tm:   6644 ; Anesthesia Minutes:   0 ; Procedure Name:   ORIF Right Ankle Fracture with Hardware ; Procedure Minutes:   0 ; Last Reviewed Dt/Tm:   08/09/2019 06:27:02 EDT            Procedure Dt/Tm:   03/02/2018 08:15:00 EST ; Location:   [facility] ; Provider:   Tessie Eke; Anesthesia Type:   General ; :   MILLIRON-MD,  MATTHEW J; Anesthesia Minutes:   0 ; Procedure Name:   Lung Biopsy and/or Wedge Resection SI Robotic (Left) ; Procedure Minutes:   034 ; Comments:     03/02/2018 11:37 EST - CARL, RN, ALISA A  auto-populated from documented surgical case ; Clinical Service:   Surgery ; Last Reviewed Dt/Tm:   08/09/2019 06:27:02 EDT            Procedure Dt/Tm:   03/02/2018 08:15:00 EST ; Location:   [facility] OR ; Provider:   Tessie Eke; Anesthesia Type:   General ; :   MILLIRON-MD,  MATTHEW J; Anesthesia Minutes:   0 ; Procedure Name:   Lung Lobectomy SI Robotic (Lymph Node, Removal) ; Procedure Minutes:   742 ; Comments:     03/02/2018 11:37 EST - CARL, RN, ALISA A  auto-populated from documented surgical case ; Clinical Service:   Surgery ; Last Reviewed Dt/Tm:   08/09/2019 06:27:02 EDT            History Confirmation   Problem History Changes PAT :   No   Procedure History Changes PAT :   No   KEMP, RN, JUDY M - 08/09/2019 6:24 EDT   Anesthesia/Sedation   Anesthesia History :   Prior general anesthesia   SN - Malignant Hyperthermia :   Denies   Previous Problem with Anesthesia :   Other: HARD TO SEDATE PER PT   Moderate Sedation History :   Prior sedation for procedure   Previous  Problem With Sedation :   None   Symptoms of Sleep Apnea :   Age greater than 33, Hypertension   Symptoms of Sleep Apnea Score :   2    Shortness of Breath Indicator :   No shortness of breath   TUCKER, RN, JILL A - 08/04/2019 14:51 EDT   Bloodless Medicine   Is Blood Transfusion Acceptable to Patient :   Yes   TUCKER, RN, JILL A - 08/04/2019 14:51 EDT   ID Risk Screen Symptoms   Recent Travel History :   No recent travel   Close Contact with COVID-19 ID :   Preadmission testing patients only   Last 14 days COVID-19 ID :   No   TB Symptom Screen :   No symptoms   C. diff Symptom/History ID :   Neither of the above   TUCKER, RN, JILL A - 08/04/2019 14:51 EDT  Immunizations   COVID-19 Vaccine Status :   1 Dose received   1 Dose Received Manufacturer :   Pfizer vaccine   1 Dose Received Date :   08/02/2019 EDT   Berline Lopes, RN, JILL A - 08/04/2019 14:51 EDT   ID COVID-19 Screen   Fever OR Chills :   No   Headache :   No   New or Worsening Cough :   No   Fatigue :   No   Shortness of Breath ID :   No   Myalgia (Muscle Pain) :   No   Dyspnea :   No   Diarrhea :   No   Sore Throat :   No   Nausea :   No   Laryngitis :   No   Sudden Loss of Taste or Smell :   No   TUCKER, RN, JILL A - 08/04/2019 14:51 EDT   Social History   Social History   (As Of: 08/09/2019 06:27:26 EDT)   Tobacco:        Tobacco use: 4 or less cigarettes(less than 1/4 pack)/day in last 30 days.  Cigarettes, 2 per day.   (Last Updated: 02/26/2018 13:47:13 EST by LAKE, RN, JENIFER L)          Electronic Cigarette/Vaping:        Never Electronic Cigarette Use.   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  INSTRUCTIONS GIVEN TO PT PER LH.  MS     Still, RN, Melody - 07/30/2019 10:43 EDT   Harm Screen   Injuries/Abuse/Neglect in Household :   Denies   Feels Unsafe at Home :   No   Last 3 mo, thoughts killing self/others :   Patient denies   KEMP, RN, JUDY M - 08/09/2019 6:24 EDT

## 2019-08-07 LAB — COVID-19: SARS COV2, NAA (BD): NOT DETECTED

## 2019-08-09 NOTE — Anesthesia Post-Procedure Evaluation (Signed)
Postanesthesia Evaluation JK        Patient:   Wanda Larson, Wanda Larson            MRN: [id number]            FIN: [phone]               Age:   59 years     Sex:  Female     DOB:  1960-05-09   Associated Diagnoses:   None   Author:   Jordan Hawks      Postoperative Information   Post Operative Info:          Post operative day: Post Anesthesia Care Unit.         Patient location: PACU.       Assessment   Postanesthesia assessment   Vitals: Vital Signs   08/09/2019 10:22 EDT Systolic Blood Pressure 118 mmHg    Diastolic Blood Pressure 69 mmHg    Temperature Oral 36.9 degC    Heart Rate Monitored 74 bpm    Respiratory Rate 17 br/min    SpO2 100 %   08/09/2019 6:17 EDT Systolic Blood Pressure 101 mmHg    Diastolic Blood Pressure 58 mmHg  LOW    Temperature Oral 36.9 degC    Heart Rate Monitored 56 bpm  LOW    Respiratory Rate 18 br/min    SpO2 96 %      .     Respiratory function: Respiratory rate, airway, and oxygen saturation are at adequate levels.     Cardiovascular function: Heart Rate stable, Blood Pressure stable, Postoperative hydration status Adequate.     Mental status: appropriate for level of anesthesia.     Temperature: within normal limits.     Pain Control: Adequate.     Nausea/Vomiting: Absent.     Signature Line     Electronically Signed on 08/09/2019 10:32 AM EDT   ________________________________________________   Jordan Hawks

## 2019-08-09 NOTE — Assessment & Plan Note (Signed)
PreOp Record - [facility]             PreOp Record - [facility] Summary                                                                     Primary Physician:        Oletta Lamas,  ANNE-MD    Case Number:              [id number]    Finalized Date/Time:      08/09/19 06:37:17    Pt. Name:                 Wanda Larson, Wanda Larson    D.O.B./Sex:               [id number]    Female    Med Rec #:                [id number]    Physician:                Delcie Roch    Financial #:              [id number]    Pt. Type:                 S    Room/Bed:                 /    Admit/Disch:              08/09/19 05:30:00 -    Institution:       [facility] Case Attendance - PreOp                                                                                              Entry 1                         Entry 2                                                                          Case Attendee             EDWARDS,  ANNE-MD               KEMP, RN, JUDY M    Role Performed            Surgeon Primary                 Preoperative Nurse    Time In     Time Out  Last Modified By:         Marden Noble RN, JUDY Johnny Bridge, RN, JUDY M                              08/09/19 06:12:21               08/09/19 06:12:21      [facility] - Case Times - PreOp                                                                                                 Entry 1                                                                                                          Patient In Room Time      08/09/19 06:12:00               Nurse In Time                   08/09/19 06:17:00    Nurse Out Time            08/09/19 06:37:00               Patient Ready for               08/09/19 06:37:00                                                              Surgery/Procedure     Last Modified By:         Marden Noble, RN, JUDY M                              08/09/19 06:37:09      [facility] - Case Times - PreOp Audit                                                                  08/09/19 06:37:09          Owner: [email or web address]  Modifier: [email or web address]                                                        <+> 1         Patient Ready for Surgery/Procedure        <+> 1         Nurse Out Time     08/09/19 06:21:27         Owner: [email or web address]                               Modifier: [email or web address]                                                        <+> 1         Nurse In Time                Finalized By: Marden Noble RN, JUDY M      Document Signatures                                                                             Signed By:           Marden Noble RN, JUDY M 08/09/19 06:37

## 2019-08-09 NOTE — Procedures (Signed)
IntraOp Record - [facility]             IntraOp Record - [facility] Summary                                                                   Primary Physician:        Oletta Lamas,  ANNE-MD    Case Number:              [phone]    Finalized Date/Time:      08/09/19 10:35:23    Pt. Name:                 Wanda Larson, Wanda Larson    D.O.B./Sex:               23-Jun-1960    Female    Med Rec #:                [id number]    Physician:                Delcie Roch    Financial #:              [id number]    Pt. Type:                 S    Room/Bed:                 /    Admit/Disch:              08/09/19 05:30:00 -    Institution:       [facility] - Case Times                                                                                                         Entry 1                                                                                                          Patient      In Room Time             08/09/19 07:59:00               Out Room Time                   08/09/19 10:19:00    Anesthesia     Procedure  Start Time               08/09/19 08:42:00               Stop Time                       08/09/19 10:04:00    Last Modified By:         Lyda Kalata RN, Elnora Morrison 08/09/19 10:19:58      [facility] - Case Times Audit                                                                          08/09/19 10:19:58         Owner: [facility]                               Modifier: [place]                                                        <+> 1         Out Room Time        <+> 1         Stop Time        [facility] - Case Attendance                                                                                                    Entry 1                         Entry 2                         Entry 3                                          Case Attendee             CONVERSE,  ROBERT C-CAA         EDWARDS,  ANNE-MD               Lyda Kalata, RN, Altha Harm  A    Role Performed            CRNA                            Surgeon Primary                 Circulator    Time In                   08/09/19 07:59:00               08/09/19 07:59:00               08/09/19 07:59:00    Time Out     Procedure                 Blepharoplasty Upper            Blepharoplasty Upper                              and/or Lower(Bilateral,         and/or Lower(Bilateral,                              Upper), Brow Lift Open          Upper), Brow Lift Open    Last Modified By:         Lyda Kalata, RN, Prince Rome, RN, Prince Rome, RN, Elnora Morrison 08/09/19 08:49:03             A 08/09/19 08:49:03             A 08/09/19 08:49:03                                Entry 4                         Entry 5                         Entry 6                                          Case Attendee             Jamison Neighbor    Role Performed            Surgical Scrub                  Anesthesiologist                First Assistant    Time In                   08/09/19 07:59:00  08/09/19 07:59:00    Time Out     Procedure     Last Modified By:         Lyda Kalata, RN, Prince Rome, RN, Prince Rome, RN, Elnora Morrison 08/09/19 08:49:03             A 08/09/19 08:49:03             A 08/09/19 08:58:33      [facility] - Case Attendance Audit                                                                     08/09/19 08:58:33         Owner: [facility]                               Modifier: [place]                                                        <+> 6         Case Attendee        <+> 6         Role Performed     08/09/19 08:49:03         Owner: [facility]                               Modifier: [place]                                                            1     <*> Procedure                              Blepharoplasty Upper  and/or Lower(Bilateral, Upper), Brow Lift                                                             Open            2     <*> Procedure                              Blepharoplasty Upper and/or Lower(Bilateral, Upper), Brow Lift  Open        <+> 3         Time In        <+> 4         Time In        <+> 5         Time In     08/09/19 08:48:45         Owner: [facility]                               Modifier: [place]                                                            1     <+> Time In            1     <*> Procedure                              Blepharoplasty Upper and/or Lower(Bilateral, Upper), Brow Lift                                                             Open            2     <+> Time In            2     <*> Procedure                              Blepharoplasty Upper and/or Lower(Bilateral, Upper), Brow Lift                                                             Open        <+> 3         Case Attendee        <+> 3         Role Performed        <+> 4         Case Attendee        <+> 4         Role Performed        <+> 5         Case Attendee        <+> 5         Role Performed        [facility] - Skin Assessment  Pre-Care Text:            A.240 Assesses baseline skin condition Im.120 Implements protective measures to prevent skin or tissue injury           due to mechanical sources  Im.280.1 Implements progective measures to prevent skin or tissue injury due to           thermal sources Im.360 Monitors for signs and symptons of infection                              Entry 1                                                                                                          Skin Integrity            Intact    Last Modified By:         Lyda Kalata, RN, Elnora Morrison 08/09/19 09:13:38    Post-Care Text:            E.10 Evaluates for signs and symptoms of  physical injury to skin and tissue E.270 Evaluate tissue perfusion           O.60 Patient is free from signs and symptoms of injury caused by extraneous objects   O.210 Patinet's tissue           perfusion is consistent with or improved from baseline levels      RHOR - Patient Positioning                                                                      Pre-Care Text:            A.240 Assesses baseline skin condition A.280 Identifies baseline musculoskeletal status A.280.1 Identifies           physical alterations that require additional precautions for procedure-specific positioning A.510.8 Maintains           patient's dignity and privacy Im.120 Implements protective measures to prevent skin/tissue injury due to           mechanical sources Im.40 Positions the patient Im.80 Applies safety devices                              Entry 1  Procedure                 Blepharoplasty Upper            Body Position                   Supine                              and/or Lower(Bilateral,                              Upper), Brow Lift Open    Left Arm Position         Tucked and Padded at            Right Arm Position              Tucked and Padded at                              Side                                                            Side    Left Leg Position         Extended Security Strap         Right Leg Position              Extended Security Strap    Feet Uncrossed            Yes                             Pressure Points                 Yes                                                              Checked     Positioning Device        Foam Padding, Pillow,           Positioned By                   [employer],  ROBERT                              Safety Strap                                                    C-CAA, EDWARDS,  Nelson Chimes, RN,                                                                                              Christine A    Outcome Met (O.80)        Yes    Last Modified By:         Lyda Kalata, RN, Elnora Morrison 08/09/19 08:53:04    Post-Care Text:            A.240 Assesses baseline skin condition A.280 Identifies baseline musculoskeletal status A.280.1 Identifies           physical alterations that require additional precautions for procedure-specific positioning A.510.8 Maintains           patient's dignity and privacy Im.120 Implements protective measures to prevent skin/tissue injury due to           mechanical sources Im.40 Positions the patient Im.80 Applies safety devices      RH[facility] - Skin Prep                                                                                Pre-Care Text:            A.30 Verifies allergies A.20 Verifies procedure, surgical site, and laterality A.510.8 Maintains paritnet's           dignity and privacy Im.270 Performs Skin Preparation Im.270.1 Implements protective measures to prevent skin           and tissue injury due to chemical sources  A.300.1 Protects from cross-contamination                              Entry 1                                                                                                          Hair Removal     Skin Prep      Prep Agents (Im.270)     Povidone Iodine                 Prep Area (Im.270)  Face, Head                              Solution 5%     Prep Area Details        Bilateral                       Prep By                         Oletta Lamas,  ANNE-MD    Outcome Met (O.100)       Yes    Last Modified By:         Lyda Kalata, RN, Christine                              A 08/09/19 09:34:03    Post-Care Text:            E.10 Evaluates for signs and symptoms of physical injury to skin and tissue O.100 Patient is free from signs           and symptoms of chemical injury  O.740 The patient's  right to privacy is maintained      RH[facility] - Counts Initial and Final                                                                 Pre-Care Text:            A.20.2 - Assesses the risk for unintended retained surgical items Im.20 - Performs required counts                              Entry 1                                                                                                          Initial Counts      Initial Counts           Lyda Kalata, RN, Christine         Items included in               [employer], Sharps     Performed By             Randolm Idol           the Initial Count     Final Counts      Final Counts             Lyda Kalata, RN, [place]         Final Count Status              Correct  Performed By             Randolm Idol     Items Included in        Sponges, Sharps     Final Count     Outcome Met (O.20)        Yes    Last Modified By:         Lyda Kalata, RN, Christine                              A 08/09/19 09:24:25    Post-Care Text:            E.50 - Evaluates results of the surgical count O.20 - Patient is free from unintended retained surgical items      RH[facility] - Counts Additional                                                                        Pre-Care Text:            A.1 Verifies operative procedure, sugical site, and laterality A.20.2 Assesses the risk for unintended           retained foreign body Im.20 Performs required counts                              Entry 1                                                                                                          Additional Count          Closing Count                   Additional Count                [place],  Joelene Millin,    Type                                                      Participants                    Lyda Kalata, Therapist, sports, [employer]  A    Count Status              Correct                         Items Counted                    Sponges, Sharps    Outcome Met (O.20)        Yes    Last Modified By:         Lyda Kalata, RN, Christine                              A 08/09/19 09:24:49    Post-Care Text:            E.50 Evaluates results of the surgical count O.20 Patient is free from unintended retained foreign objects      RH[facility] - General Case Data                                                                        Pre-Care Text:            A.350.1 Classifies surgical wound                              Entry 1                                                                                                          Case Information      ASA Class                3                               Case Level                      Level 3     OR                       RH7 04                          Specialty                       Plastic (SN)     Wound Class              1-Clean    Preop Diagnosis           H02.31 H02.34 H57.812  Postop Diagnosis                cosmetic    Last Modified By:         Lyda Kalata, RN, Elnora Morrison 08/09/19 09:04:30    Post-Care Text:            O.760 Patient receives consistent and comparable care regardless of the setting      [facility] - Fire Risk Assessment                                                                                               Entry 1                                                                                                          Fire Risk                 Surgical Site Above             Fire Risk Score                 2    Assessment: If            Xiphoid, Ignition    checked, checkmark        Source In Use    = 1 point     Last Modified By:         Lyda Kalata, RN, Elnora Morrison 08/09/19 09:04:42      RH[facility] - Time Out                                                                                 Pre-Care Text:            A.10 Confirms patient identity A.20 Verifies operative procedure, surgical site, and laterality A.20.1  Verifies           consent for planned procedure A.30 Verifies allergies  Entry 1                                                                                                          Procedure                 Blepharoplasty Upper            Is everyone ready               Yes                              and/or Lower(Bilateral,         to perform time out                               Upper), Brow Lift Open    Have all members of       Yes                             Patient name and                Yes    the surgical team                                         DOB confirmed     been introduced     Allergies discussed       Yes                             Surgical procedure              Yes                                                              to be performed                                                               confirmed and                                                               verified by  completed surgical                                                               consent     Correct surgical          Yes                             Correct laterality              Yes    site marked and                                           confirmed     initials are     visible through     prepped and draped     field (or     alternative ID band     used)     Correct patient           Yes                             Surgeon shares                  Yes    position confirmed                                        operative plan,                                                               possible                                                               difficulties,                                                               expected duration,                                                               anticipated blood  loss and reviews                                                                all                                                               critical/specific                                                               concerns     Required blood            Yes                             Essential imaging               Yes    products, implants,                                       available and fetal     devices and/or                                            heartones confirmed     special equipment                                         (if applicable)     available and     sterility confirmed     VTE prophylaxis           Yes                             Antibiotics ordered             Yes    addressed                                                 and administered     Anesthesia shares         Yes                             Fire risk                       Yes    anesthetic plan  and                                       assessment scored     reviews patient                                           and plan discussed     specific concerns     Appropriate drying        Yes                             Surgeon states:                 Yes    time for prep                                             Does anyone have     observed before                                           any concerns? If     draping                                                   you see, suspect,                                                               or feel that                                                               patient care is                                                               being compromised,                                                               speak up for  patient safety     Time Out Complete         08/09/19 08:42:00    Last Modified By:         Lyda Kalata, RN, Christine                              A 08/09/19 09:03:09    Post-Care Text:             E.30 Evaluates verification process for correct patient, site, side, and level surgery      [facility] - Debrief                                                                                  Pre-Care Text:            Im.330 Manages specimen handling and disposition                              Entry 1                                                                                                          Procedure                 Blepharoplasty Upper            Final counts                    Yes                              and/or Lower(Bilateral,         correct and                               Upper), Brow Lift Open          verbally verified                                                               with  surgeon/licensed                                                               independent                                                               practitioner (if                                                               applicable)     Actual procedure          Yes                             Postop diagnosis                Yes    performed confirmed                                       confirmed     Wound                     Yes                             Confirm specimens               Yes    classification                                            and specimens     confirmed                                                 labeled                                                               appropriately (if                                                               applicable)  Foley catheter            Yes                             Patient recovery                Yes    removed (if                                               plan confirmed     applicable)     Debrief Complete          08/09/19 10:17:00    Last Modified By:         Lyda Kalata, RN, Christine                              A 08/09/19 10:34:08    Post-Care  Text:            E.800 Ensures continuity of care E.50 Evaluates results of the surgical count O.30 Patient's procedure is           performed on the correct site, side, and level O.50 patient's current status is communicated throughout the           continuum of care O.40 Patient's specimen(s) is managed in the appropriate manner      [facility] - Debrief Audit                                                                             08/09/19 10:34:08         Owner: [facility]                               Modifier: [place]                                                            1     <*> Procedure                              Blepharoplasty Upper and/or Lower(Bilateral, Upper), Brow Lift                                                             Open            1     <*> Debrief Complete                       08/09/19 10:21:00  08/09/19 10:33:54         Owner: [facility]                               Modifier: [place]UR[place]                                                            1     <*> Procedure                              Blepharoplasty Upper and/or Lower(Bilateral, Upper), Brow Lift                                                             Open            1     <+> Debrief Complete        RHOR - Cautery                                                                                  Pre-Care Text:            A.240 Assesses baseline skin condition A280.1 Identifies baseline musculoskeletal status Im.50 Implements           protective measures to prevent injury due to electrical sources  Im.60 Uses supplies and equipment within safe           parameters Im.80 Applies safety devices                              Entry 1                                                                                                          ESU Type                  GENERATOR                       Identification                  [id number]  COVIDIEN/[employer]              Number     Coag Setting (watts)      25                               Cut Setting (watts)             25    Bipolar Setting           45                              Grounding Pad                   Yes    (watts)                                                   Needed?     Grounding Pad Site        Thigh, left                     Grounding Marliss Coots, RN, [employer] By                      A    Outcome Met (O.10)        Yes    Last Modified By:         Lyda Kalata, RN, Christine                              A 08/09/19 09:29:06    Post-Care Text:            E.10 Evaluates for signs and symptoms of physical injury to skin and tissue O.10 Patient is free from signs and           symptoms of injury related to thermal sources  O.70 Patient is free from signs and symptoms of electrical injury      RH[facility] - Patient Care Devices                                                                     Pre-Care Text:            A.200 Assesses risk for normothermia regulation A.40 Verifies presence of prosthetics or corrective devices           Im.280 Implements thermoregulation measures Im.60 Uses supplies and equipment within safe parameters  Entry 1                         Entry 2                                                                          Equipment Type            MACHINE SEQUENTIAL              BAIR HUGGER                              COMPRESSION    SCD Sleeve Site           Legs Bilateral    Equipment/Tag Number      [id number]                       [id number]    Initiated Pre             Yes    Induction     Last Modified By:         Lyda Kalata, RN, Prince Rome, RN, Elnora Morrison 08/09/19 09:35:25             A 08/09/19 09:35:25    Post-Care Text:            E.10 Evaluates signs and symptoms of physical injury to skin and tissue O.60 Patient is free from signs and           symptoms of injury caused by  extraneous objects      RHOR - Medications                                                                              Pre-Care Text:            A.10 Confirms patient identity A.30 Verifies allergies Im.220 Administers prescribed medications Im.220.2           Administers prescribed antibiotic therapy as ordered                              Entry 1  Time Administered         08/09/19 08:40:00               Medication                      LIDOCAINE 1% W/EPI 20ML                                                                                              VIAL    Route of Admin            Local Injection                 Dose/Volume                     20 ml                                                              (include amount and                                                               unit of measure)     Site                      Head                            Site Detail                     Bilateral    Administered By           Oletta Lamas,  ANNE-MD               Outcome Met (O.130)             Yes    Last Modified By:         Lyda Kalata, RN, Christine                              A 08/09/19 09:07:13    Post-Care Text:            E.20 Evaluates response to medications O.130 Patient receives appropriately administerd medication(s)      RHOR - Medications Audit  08/09/19 09:07:13         Owner: [place]                               Modifier: [place]                                                            1     <*> Medication                             LIDOCAINE 1% W/EPI 20ML VIAL            1     <*> Administered By                        Lyda Kalata, RN, Christine A        RHOR - Tubes, Drains, Catheters                                                                 Pre-Care Text:            A.310 Identifies factors associated with an  increased risk for hemorrhage or fluid and electrolyte imbalance           Im.250 Administers care to invasive device sites                              Entry 1                                                                                                          Device Description        [place]             Location                        Head                              PRATT 10FR W/ TROCAR    Last Modified By:         Lyda Kalata, RN, Christine                              A 08/09/19 09:12:49    Post-Care Text:            E.340 Evaluates tubes and drains are  intact and functioning as planned O.60 Patient is free from signs and           symptoms of injury caused by extraneous objects      [facility] - Communication                                                                            Pre-Care Text:            A.520 Identifies barriers to communication (Patient and Family Communications) A.20 Verifies operative           procedure, surgical site, and laterality Education officer, museum) Im.500 Provides status reports to family           members Im.150 Develops individualized plan of care                              Entry 1                                                                                                          Communication             Phone Call                      Communication By                Lyda Kalata RN, Elnora Morrison    Date and Time             08/09/19 09:00:00               Communication To                LM ON DAUGHTER VM    Last Modified By:         Lyda Kalata, RN, Elnora Morrison 08/09/19 09:23:01    Post-Care Text:            E.520 Evaluates psychosocial response to plan of care O.500 Patient or designated support person demonstrates  knowledge of the expected psychosocial responses to the procedure E.800 Ensures continuity of care O.50            Patient's current status is communicated throughout the continuum of care      [facility] - Dressing/Packing                                                                         Pre-Care Text:            A.350 Assesses susceptibility for infection Im.250 Administers care to invasive devices Im.290 Administer care           to wound sites  Im.300 Implements aseptic technique                              Entry 1                         Entry 2                                                                          Site                      Head                            Eyelid Upper    Site Details                                              Bilateral    Dressing Item     Details      Packing (Im.290)      Dressing Item            ABD, Cotton Batting,            Wound Closure Strip     (Im.290)                 Kerlix/Kling Wrap     Miscellaneous      (Im.290)      Cast/Splint (Im.290)     Last Modified By:         Lyda Kalata, RN, Prince Rome, RN, Christine                              A 08/09/19 09:32:35             A 08/09/19 09:32:35    Post-Care Text:            E.320 Evaluate factors associted with increased risk for postoperative infection at the completion of the  procedure O.200 Patient's wound perfusion is consistent with or improved from baseline levels  O.Patient is           free from signs and symptoms of infection      [facility] - Procedures                                                                               Pre-Care Text:            A.20 Verifies operative procedure, surgical site, and laterality Im.150 Develops individualized plan of care                              Entry 1                         Entry 2                                                                          Procedure     Description      Procedure                Blepharoplasty Upper            Brow Lift Open                              and/or Lower     Modifiers                Bilateral, Upper      Surgical Procedure       BILATERAL UPPER                 BROWLIFT     Text                     BLEPHAROLPLASTY    Primary Procedure         Yes                             No    Primary Surgeon           EDWARDS,  ANNE-MD               EDWARDS,  ANNE-MD    Start                     08/09/19 08:42:00               08/09/19 08:42:00    Stop                      08/09/19 10:04:00               08/09/19 10:04:00    Anesthesia Type  General                         General    Surgical Service          Plastic (SN)                    Plastic (SN)    Wound Class               1-Clean                         1-Clean    Last Modified By:         Lyda Kalata, RN, Prince Rome, RN, Elnora Morrison 08/09/19 10:34:11             A 08/09/19 10:34:11    Post-Care Text:            O.730 The patinet's care is consistent with the individualized perioperative plan of care      [facility] - Procedures Audit                                                                          08/09/19 10:34:11         Owner: [facility]                               Modifier: [place]                                                        <+> 1         Stop        <+> 2         Stop        RH[facility] - Transfer                                                                                                           Entry 1  Transferred By            Lyda Kalata, RN, Christine         Via                             Stretcher                              [place]ep    Post-op Destination       PACU    Skin Assessment      Condition                Intact    Last Modified By:         Lyda Kalata RN, Christine                              A 08/09/19 09:23:59      Case Comments                                                                                         <None>               Finalized By: Lyda Kalata RN, Elnora Morrison      Document Signatures                                                                             Signed By:           Lyda Kalata RN, Elnora Morrison 08/09/19 10:35

## 2019-08-09 NOTE — Op Note (Signed)
Phase I Record - [facility]             Phase I Record - [facility] Summary                                                                   Primary Physician:        EDWARDS,  ANNE-MD    Case Number:              [id number]    Finalized Date/Time:      08/09/19 12:12:17    Pt. Name:                 LAURABETH, DAYAN    D.O.B./Sex:               03-19-1961    Female    Med Rec #:                [id number]    Physician:                Tilda Franco    Financial #:              [id number]    Pt. Type:                 S    Room/Bed:                 /    Admit/Disch:              08/09/19 05:30:00 -    Institution:       [facility] Case Attendance - Phase I                                                                                            Entry 1                         Entry 2                                                                          Case Attendee             EDWARDS,  ANNE-MD               COLLEY, RN, ELLIE D    Role Performed            Surgeon Primary                 Post Anesthesia Care  Nurse    Time In     Time Out     Last Modified By:         Tanna Furry RN, Tobie Lords, RN, ELLIE D                              08/09/19 12:11:55               08/09/19 12:11:55      [facility] - Case Times - Phase I                                                                                               Entry 1                                                                                                          Phase I In                08/09/19 10:22:00               Phase I Out                     08/09/19 11:12:00    Phase I Discharge         08/09/19 11:12:00    Time     Last Modified By:         Kristeen Mans, Quitman Livings D                              08/09/19 12:12:17              Finalized By: Remer Macho D      Document Signatures                                                                             Signed By:           Kristeen Mans, Quitman Livings  D 08/09/19 12:12

## 2019-08-09 NOTE — Op Note (Signed)
Phase II Record - [facility]             Phase II Record - [facility] Summary                                                                  Primary Physician:        EDWARDS,  ANNE-MD    Case Number:              [id number]    Finalized Date/Time:      08/09/19 12:12:48    Pt. Name:                 Wanda Larson, Wanda Larson    D.O.B./Sex:               04/13/1961    Female    Med Rec #:                [id number]    Physician:                Delcie Roch    Financial #:              [id number]    Pt. Type:                 S    Room/Bed:                 /    Admit/Disch:              08/09/19 05:30:00 -    Institution:       [facility] Case Attendance - Phase II                                                                                           Entry 1                                                                                                          Case Attendee             Wynetta Fines RN, Normand Sloop D             Role Performed                  Post Anesthesia Care  Nurse    Last Modified By:         Kristeen Mans, Quitman Livings D                              08/09/19 12:12:29      RH[facility] - Case Times - Phase II                                                                                              Entry 1                                                                                                          Phase II In               08/09/19 11:12:00               Phase II Out                    08/09/19 12:11:00    Phase II Discharge        08/09/19 12:11:00    Time     Last Modified By:         Kristeen Mans, Quitman Livings D                              08/09/19 12:12:40              Finalized By: Remer Macho D      Document Signatures                                                                             Signed By:           Kristeen Mans, Quitman Livings D 08/09/19 12:12

## 2019-08-09 NOTE — Nursing Note (Signed)
Nursing Discharge Summary - Text       Physician Discharge Summary Entered On:  08/09/2019 11:01 EDT    Performed On:  08/09/2019 10:59 EDT by Randa Evens,  Madisin Hasan-MD               DC Information   Provider Instructions for Diet :   A Healthy Diet   Provider Instructions for Activity :   Limit bending or turning of neck;  no overhead work or looking up, No bending, No bending, twisting or lifting, No driving, No lifting, Walk with help if needed   Provider Instructions for Wound Care :   Keep your dressing clean and dry, May shower, Other: shower after 24 hours. Keep head elevated. Use ice to bilateral eyes 20 minutes every hour. Vaseline to incision on eye bilaterally. Use eye lubricant to bilateral eyes before sleep   Iyad Deroo,  Lanai Conlee-MD - 08/09/2019 10:59 EDT

## 2019-08-09 NOTE — Case Communication (Signed)
Discharge Follow-Up Form - Text       Discharge Follow-Up Entered On:  08/10/2019 10:49 EDT    Performed On:  08/10/2019 10:47 EDT by [name] RN, Wanda Larson               Clinical   Provider Follow-Up Post Discharge RTF :   Follow-Up Appointments    With:  [facility]  Address:  business (1), [address] [name], [place], [address];[phone] Business (1)  When:  08/16/2019 13:30    With:  [name]  Address:  business (1), [address] [name], [place], [address];[phone] Business (1)  When:  08/16/2019 13:30    With:  [name]  Address:  business (1), [address] [name], [place], [address];[phone] Business (1)       [name], Doctor, general practice - 08/10/2019 10:47 EDT   Surgery Evaluation   CM Surg DC Instr Clr :   Yes   CM Surg Pain Controlled :   Yes   CM Surg Nausea/Vomiting :   Yes   CM Surg FU Appt :   No   CM Surg Staff Recognize :   Yes   CM Surg Staff Recognize Names :   EVERYONE WAS WONDERFUL!   CM Surg Add'l Questions :   NONE   CM Surg How To Improve Exp :   [name], RNLupita Larson - 08/10/2019 10:47 EDT   Status   Case Status :   Completed   [name] - 08/10/2019 10:47 EDT

## 2019-08-09 NOTE — Op Note (Signed)
Operative Report    [facility]  [name], MD  Service Date: 08/09/2019    PREOPERATIVE DIAGNOSIS:    1.  Brow ptosis with visual impairment.  2.  Blepharochalasis.    PROCEDURE:    1.  Colonel forehead lift.  2.  Upper lid blepharoplasty.    SURGEON:  Dr. Thurston Hole L.  Emani Morad.    ASSISTANT:  Gaetana Michaelis, RN.    ANESTHESIA:  General.    ESTIMATED BLOOD LOSS:  50 mL    DRAINS:  None.    SPECIMENS:  None.    COMPLICATIONS:  None.    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  She  presents now for a forehead lift and upper blepharoplasty.    DESCRIPTION OF PROCEDURE:  The patient was marked in the preoperative  area.  She was taken to the operating room, placed on the table in the  supine position.  She was prepped and draped in a sterile fashion.  A  timeout was initiated and was correct.  Surgery began with a coronal  incision from superior helix to the superior helix.  This was taken  down to the area just above the periosteum.  This was undermined until  the superior orbital rim was encountered.  A Glorious Peach was then used to  detach the periosteum from the area of the eyebrows.  Care was done to  spare the trochlear and supraorbital nerve.  When this was  accomplished, the forehead flap was brought back for the initial  incision.  Measured amount of skin was marked to be removed.  This was  done with the scalpel and electrocautery.  There was essentially no  bleeding.  Hemostasis was achieved; however.  The wound was  approximated with stainless steel clips.  No drain was placed.    Attention was then turned toward the upper eyelids, which had been  marked conservatively.  These were both injected with 1% Xylocaine  1:100,000 of epinephrine and 1.5 mL per side.  An ellipse of skin was  removed from the right upper eyelid.  Hemostasis was achieved with  electrocautery.  She also had hypertrophic medial fat pads.  The  septum was  spread and this extra fat was taken with care to cauterize  the blood supply.  This was done on both sides and closure was with  interrupted and running 6-0 Monocryl.  I should mention that eye  shields and lubricant were placed in the eyes before the beginning of  the procedure.  The patient needed no dressings.  She was discharged  from the operating room to good condition to the PAR.      [name], MD  TR: tn DD: 08/19/2019 10:22 TD: 08/19/2019 10:58 Job#: [id number]  \\[id number]\\DOC#: [id number]  \\[id number]\\  Signature Line    Electronically Signed on 08/19/2019 12:58 PM EDT  ________________________________________________  Randa Evens,  Mahati Vajda-MD

## 2019-08-09 NOTE — Anesthesia Pre-Procedure Evaluation (Signed)
Preanesthesia Evaluation JK        Patient:   Wanda Larson, Wanda Larson            MRN: [id number]            FIN: [id number]               Age:   59 years     Sex:  Female     DOB:  08/25/1960   Associated Diagnoses:   None   Author:   Durenda Hurt      Preoperative Information   no solids since MN, clear liquids >4 hours   Anesthesia history     Patient's history: negative.     Family's history: negative.        Health Status   Allergies:    Allergic Reactions (Selected)  Moderate  Erythromycin- Hives.   Current medications:    Home Medications (14) Active  amLODIPine 5 mg oral tablet 5 mg = 1 tabs, Oral, Daily  atenolol 50 mg oral tablet 50 mg = 1 tabs, Oral, Daily  CeleBREX 200 mg oral capsule 200 mg = 1 caps, Oral, BID  Chantix Continuing Month 1 mg oral tablet 1 mg = 1 tabs, Oral, BID  docusate 100 mg, PRN, Oral, BID  gabapentin 400 mg oral capsule 800 mg, Oral, qAM  gabapentin 400 mg oral capsule 1,200 mg, Oral, qPM  Lidoderm 5% topical film 1 patches, Topical, q24hr  magnesium oxide 400 mg (241.3 mg elemental magnesium) oral tablet 400 mg = 1 tabs, Oral, BID  omeprazole 40 mg oral delayed release capsule 40 mg = 1 caps, Oral, Daily  oxyCODONE-acetaminophen 10 mg-325 mg oral tablet range dose 0.5 tabs, PRN, Oral, TID  traMADol 50 mg oral tablet 50 mg = 1 tabs, PRN, Oral, q6hr  Tylenol 1,000 mg = 2 tabs, Oral, q8hr-INT  Vitamin D 1000 intl units oral tablet 3 tabs, Oral, Daily  ,    Medications (12) Active  Scheduled: (2)  ceFAZolin duplex  2 g 50 mL, IV Piggyback, On Call  sodium chloride 0.9% Inj Soln 10 mL syringe  30 mL, IV Push, q8hr  Continuous: (2)  Lactated Ringers Injection solution 1,000 mL  1,000 mL, IV, 10 mL/hr  Sodium Chloride 0.9% intravenous solution 500 mL  500 mL, IV, 10 mL/hr  PRN: (8)  A Patient Specific Medication  1 EA, Kit-Combo, q61mn  A Patient Specific Refrigerated Medication  1 EA, Kit-Combo, q551m  Delivery and Return Bin Access  1 EA, Kit-Combo, q5m51m lidocaine 1% PF Inj Soln 2 mL  0.25  mL, ID, q5mi26mRespiratory MDI Treatment  1 EA, Kit-Combo, q5min53modium chloride 0.9% Inj Soln 10 mL syringe  30 mL, IV Push, q5min 44mdium chloride 0.9% Inj Soln 10 mL vial PF  30 mL, IV Push, q5min  71mrile water Inj Soln 10 mL  10 mL, N/A, q5min   68mroblem list:    Active Problems (5)  Chronic back pain   DDD (degenerative disc disease), cervical   DDD (degenerative disc disease), lumbar   GERD (gastroesophageal reflux disease)   HTN (hypertension)         Histories   Past Medical History:    No active or resolved past medical history items have been selected or recorded.   Procedure history:    Lung Biopsy and/or Wedge Resection SI Robotic (Left) on 03/02/2018 at 57 Years51 Comments:  03/02/2018 11:37 EST - CARL, RNGlendell DockerISA  A  auto-populated from documented surgical case  Lung Lobectomy SI Robotic (Lymph Node, Removal) on 03/02/2018 at 57 Years.  Comments:  03/02/2018 11:37 EST - CARL, RN, ALISA A  auto-populated from documented surgical case  ORIF Right Ankle Fracture with Hardware in 2000 at 38 Years.  Hysterectomy ([id number]).  Colonoscopy ([id number]).   Social History        Social & Psychosocial Habits    Alcohol  02/26/2018  Use: Denies    Substance Use  08/04/2019  Opioid Assessment Opioid Tolerant - > 1 wk    Use: Current    Type: Prescript Med prescribed    Recent Intake: 48 hours or less    Tobacco  02/26/2018  Use: 4 or less cigarettes(less    Type: Cigarettes    Tobacco use per day: 2    Electronic Cigarette/Vaping  08/04/2019  Electronic Cigarette Use: Never  .     Symptoms of Sleep Apnea Score: PAT Documentation: 2                        07/30/2019 10:43 EDT     PONV Risk Score: PAT Documentation: 2                        08/04/2019 15:06 EDT        Physical Examination   Vital Signs   9/32/3557 3:22 EDT Systolic Blood Pressure 025 mmHg    Diastolic Blood Pressure 58 mmHg  LOW    Temperature Oral 36.9 degC    Heart Rate Monitored 56 bpm  LOW    Respiratory Rate 18 br/min    SpO2 96 %         Vital  Signs (last 24 hrs)_____  Last Charted___________  Temp Oral     36.9 degC  (APR 26 06:17)  Resp Rate         18 br/min  (APR 26 06:17)  SBP      101 mmHg  (APR 26 06:17)  DBP      L 64mHg  (APR 26 06:17)  SpO2      96 %  (APR 26 06:17)  BMI      22.34  (APR 26 06:22)     Measurements from flowsheet : Measurements   08/09/2019 6:22 EDT Body Mass Index est meas 22.34 kg/m2    Body Mass Index Measured 22.34 kg/m2   08/09/2019 6:17 EDT Patient Stated Height/Length 162.6 cm    Weight Dosing 59.0 kg      Documented vital signs   Pain assessment:  Pain Assessment   08/09/2019 6:17 EDT       Numeric Rating Pain Scale 0 = No pain     .    Airway:       Mallampati classification: II (soft palate, fauces, uvula visible).    Respiratory:  Lungs are clear to auscultation.    Cardiovascular:  Normal rate, Regular rhythm.    Neurologic:  Alert, Oriented.       Review / Management   Results review:     No qualifying data available.       Assessment and Plan   American Society of Anesthesiologists#(ASA) physical status classification:  Class II.    Anesthetic Preoperative Plan     Anesthetic technique: General anesthesia, Inhalation.     Maintenance airway: Oral endotracheal tube.     Opioid Assessment: Opioid Tolerant.     Risks discussed:  nausea, vomiting, sore throat, dental injury, hypotension, allergic reaction, serious complications.     Signature Line     Electronically Signed on 08/09/2019 07:22 AM EDT   ________________________________________________   Durenda Hurt

## 2021-03-05 ENCOUNTER — Encounter

## 2021-06-01 ENCOUNTER — Ambulatory Visit
Admit: 2021-06-01 | Discharge: 2021-06-01 | Payer: MEDICARE | Attending: Neurological Surgery | Primary: Internal Medicine

## 2021-06-01 DIAGNOSIS — M4185 Other forms of scoliosis, thoracolumbar region: Secondary | ICD-10-CM

## 2021-06-01 NOTE — Progress Notes (Signed)
NEUROSURGICAL CONSULTATION    Chief Complaint(s):   Back pain.    HPI:                    Wanda Larson is a 61 y.o. female who has back pain since 2011.  She reports that the pain is getting worse.  Her pain radiates throughout the cervical, thoracic, and lumbar regions.  She rates her pain as a 7 on a scale of 10.  The pain is dull and achy.  She does report numbness, tingling, and weakness in her hands.  Wearing her back brace, laying down, and taking medications helps with her symptoms.  Standing in 1 place exacerbates her back pain.  Has had 4 weeks of physical therapy.  She has also done 1 month of chiropractic treatment which exacerbated her symptoms.  She also reports that she does spinal injections every 3 to 6 months since 2011.           Current Medications:    Current Outpatient Medications:     amLODIPine (NORVASC) 5 MG tablet, 1 tablet Orally Once a day, Disp: , Rfl:     atenolol (TENORMIN) 50 MG tablet, 1 tablet Orally Once a day, Disp: , Rfl:     gabapentin (NEURONTIN) 400 MG capsule, 2 capsules in am and 3 tablets pm orally BID, Disp: , Rfl:     omeprazole (PRILOSEC) 40 MG delayed release capsule, 1 capsule, Disp: , Rfl:     oxyCODONE-acetaminophen (PERCOCET) 10-325 MG per tablet, 1 tablet as needed Orally every 6 hrs, Disp: , Rfl:     The patient is allergic to erythromycin base.    Past Medical History  Wanda Larson  has no past medical history on file.    Past Surgical History  The patient  has no past surgical history on file.    Family History  This patient's family history is not on file.    Social History  Wanda Larson  reports that she has never smoked. She has never used smokeless tobacco.      Objective:  There were no vitals filed for this visit.     Physical Exam  Constitutional:       General: She is awake.      Appearance: Normal appearance.   Neurological:      Mental Status: She is alert and oriented to person, place, and time.      Cranial Nerves: No dysarthria or facial asymmetry.      Sensory:  Sensation is intact.      Motor: Motor function is intact. No weakness.      Gait: Gait is intact.      Deep Tendon Reflexes: Babinski sign absent on the right side. Babinski sign absent on the left side.      Reflex Scores:       Tricep reflexes are 2+ on the right side and 2+ on the left side.       Bicep reflexes are 2+ on the right side and 2+ on the left side.       Patellar reflexes are 2+ on the right side and 2+ on the left side.       Achilles reflexes are 2+ on the right side and 2+ on the left side.  Psychiatric:         Behavior: Behavior is cooperative.     No incisions on back.  No stigmata of myelomeningocele.  Obvious coronal deformity with standing.  The asymmetry of  the shoulders and pelvis.  Hips are nontender to palpation.  SI joints are nontender to palpation.  She points to pain over her left iliac crest.              Summary of Radiographic Findings: I independently interpreted the patient's imaging studies in the context of the clinical presentation.    I reviewed her outside lumbar MRI scan.  I measure at least 26 degrees of scoliosis with concavity to the right on the supine studies.  Significant lateral listhesis of L2 on L3.  Multilevel spondylosis.  No significant high-grade neural compression in neural foramina or spinal canal.    Assessment & Plan:  Scoliosis.  Multilevel lumbar spondylosis.  Pain related to scoliosis.    I am seeing this very pleasant patient regarding back pain.  She has been dealing with back pain for over 1 decade related to her scoliosis.  In the past year, she has had a significant increase in her pain.  She has noticed progression of her shoulder asymmetry and curvature in her back.    She has tried conservative treatments including lumbar epidural injections for 12 years.  She has recently done 4 weeks of physical therapy and she has transitioned to a home exercise program.  She has also required medications including narcotics.    She has significant back pain  related to her scoliosis, and I do think surgery is going to be the most durable option to improve her quality of life.    We had a general discussion a about scoliosis surgery.  She understands this is a major intervention with significant physiological stress and a prolonged recovery.    I explained that scoliosis surgery, in this case, is elective and she needs to be optimized prior to the operation.    Also explained that she will likely benefit from treatment in an academic center, given the extent of her spinal deformity.    I have recommended the following:  1.  Scoliosis x-rays to document her global spinal balance, weak incidence, lumbar lordosis etc.  2.  DEXA scan to assess her bone density.  She had a bone density study many years ago that demonstrated osteopenia.  I explained she will likely need bone fortifying medications for several months prior to any spinal deformity operation.  3.  I will follow-up with her after the studies are complete.  We will discuss next steps.  4.  We discussed the importance of ongoing exercise to maintain her core strength.  5.  I advised her to minimize wearing her back brace as this will lead to increased muscle deconditioning.    All questions were answered.  She is comfortable with this plan.    60 TT, 45 CT.    Jonette Eva, MD

## 2021-06-01 NOTE — Addendum Note (Signed)
Addended by: Valentina Shaggy on: 06/01/2021 12:43 PM     Modules accepted: Orders

## 2021-06-08 NOTE — Telephone Encounter (Addendum)
Pt states she was in last week to see Dr. Jennette Kettle and he had mentioned getting bone density study but she had not heard anything about scheduling yet.  I see order for scoliosis series xrays but I do not see any order for the bone density by Dr. Jennette Kettle.  Can you please check on this?  Pt would also like to get back the MRI disc that she brought to her appt but forgot to ask while she was there.  Please call pt at [phone].

## 2021-06-11 NOTE — Telephone Encounter (Signed)
Pt is aware that she already had a bone density ordered and its still good. And she was given scheduling's number to call and set up that appt.

## 2021-06-21 ENCOUNTER — Inpatient Hospital Stay: Payer: MEDICARE | Primary: Family Medicine

## 2021-06-22 NOTE — Telephone Encounter (Signed)
Pt states she went to [facility] to do her scoliosis study and she got checked in and registered, and then a man came out and sat down with her and explained they weren't able to do the imaging due to something with the machine.  Pt is unsure of where to go to get this completed as [facility] does not do them either.

## 2021-06-22 NOTE — Telephone Encounter (Signed)
Spoke with the Pt and let her know that the other hospitals also can do the xray she needs to get done.

## 2021-06-25 ENCOUNTER — Inpatient Hospital Stay: Admit: 2021-06-25 | Payer: MEDICARE | Primary: Family Medicine

## 2021-06-25 DIAGNOSIS — M4185 Other forms of scoliosis, thoracolumbar region: Secondary | ICD-10-CM

## 2021-07-05 ENCOUNTER — Ambulatory Visit: Payer: MEDICARE | Primary: Family Medicine

## 2021-08-01 ENCOUNTER — Ambulatory Visit: Payer: MEDICARE | Primary: Family Medicine

## 2021-08-01 ENCOUNTER — Encounter

## 2021-08-01 DIAGNOSIS — Z1231 Encounter for screening mammogram for malignant neoplasm of breast: Secondary | ICD-10-CM

## 2021-08-01 DIAGNOSIS — Z803 Family history of malignant neoplasm of breast: Secondary | ICD-10-CM

## 2021-08-23 ENCOUNTER — Ambulatory Visit: Payer: MEDICARE | Primary: Family Medicine

## 2021-08-23 DIAGNOSIS — M810 Age-related osteoporosis without current pathological fracture: Secondary | ICD-10-CM

## 2021-09-04 NOTE — Progress Notes (Signed)
I spoke with patient and provided her DEXA results.  She plans to try to seek an appointment at [facility] in [place] for treatment of scoliosis.  If not, she will revisit [facility] option.

## 2021-09-05 ENCOUNTER — Encounter: Attending: Neurological Surgery | Primary: Family Medicine

## 2021-10-10 NOTE — Telephone Encounter (Signed)
Patient spoke with then [facility] clinic in [facility] and they are requesting a referral to be sent over   Tel:[phone]

## 2021-10-11 ENCOUNTER — Encounter

## 2021-12-26 NOTE — Telephone Encounter (Signed)
ERROR

## 2021-12-26 NOTE — Telephone Encounter (Signed)
WORKING MEDIA MANAGER- SHOULDER    Spoke patient who advise that she is going to her physician back in [place] to see what he recommends her to do, and will back for scheduling if needed.

## 2022-01-08 NOTE — Telephone Encounter (Signed)
Incoming referral/media manager    Pt referred to ortho for tricep/arm pain  1st attempt-lvm

## 2022-01-14 NOTE — Telephone Encounter (Signed)
2nd attempt- vm was not available at this time

## 2022-01-18 NOTE — Telephone Encounter (Signed)
Pt left the call

## 2022-01-21 NOTE — Telephone Encounter (Signed)
Pt states that prior to seeing the [facility] based on the referral that was placed by [place] they are requiring an updated MRI and scoliosis xrays to be done and sent with the referral. Pt is requesting to have return call and discuss [phone]

## 2022-01-21 NOTE — Telephone Encounter (Signed)
Pt states that she can not go to the [facility] now due to insurance & wants Dr Nori Riis to call her & advise what she should do. I informed pt that Dr Nori Riis was in [facility] today.

## 2022-01-22 NOTE — Telephone Encounter (Signed)
Message has been left with provider's advise.

## 2022-01-23 ENCOUNTER — Ambulatory Visit: Admit: 2022-01-23 | Discharge: 2022-01-23 | Payer: MEDICARE | Attending: Surgical | Primary: Family Medicine

## 2022-01-23 ENCOUNTER — Ambulatory Visit: Admit: 2022-01-23 | Discharge: 2022-01-23 | Payer: MEDICARE | Primary: Family Medicine

## 2022-01-23 DIAGNOSIS — M7581 Other shoulder lesions, right shoulder: Secondary | ICD-10-CM

## 2022-01-23 NOTE — Progress Notes (Signed)
REASON FOR APPOINTMENT:  Wanda Larson (DOB:  06-Dec-1960) is a 61 y.o. female, who presents with a chief complaint of right-sided neck pain and right shoulder pain.      Subjective   SUBJECTIVE:  The patient is a 61 year old left-hand-dominant disabled woman who presents today for evaluation of an approximate 4-week history of right-sided neck pain radiating into her right shoulder.  The patient denies any acute injury or trauma, but she does remember putting a hanging plant up on a hook when she felt an acute sharp pain that did resolve.  Her pain seems to radiate into her right triceps with intermittent tingling sensation in all 5 fingers on the right.  She has tried Biofreeze    VITALS:  Ht 5\' 4"  (1.626 m)   Wt 132 lb (59.9 kg)   BMI 22.66 kg/m  Body mass index is 22.66 kg/m.     ALLERGIES:  Allergies   Allergen Reactions    Erythromycin Hives     Event:    Other reaction(s): Hives  Other reaction(s): Hives  States she is able to take Azithromycin  States she is able to take Azithromycin      Prednisone Shortness Of Breath     Dyspnea with High Dose Prednisone  Other reaction(s): Shortness Of Breath  States she is able to take DepoMedrol injection  Dyspnea with High Dose Prednisone  States she is able to take DepoMedrol injection  Dyspnea with High Dose Prednisone  Other reaction(s): Shortness Of Breath  States she is able to take DepoMedrol injection  Dyspnea with High Dose Prednisone  States she is able to take DepoMedrol injection      Citalopram Other (See Comments)     No relief  No relief      Other Hives     Mycin drugs    Sertraline Hcl      jittery    Atorvastatin Other (See Comments)     Muscle aches  Muscle aches  myalgia      Erythromycin Base Rash    Rosuvastatin Other (See Comments)     Muscle aches  Muscle aches  myalgia      Sertraline Other (See Comments)     jittery  jittery  jittery          PAST MEDICAL HISTORY:   History reviewed. No pertinent past medical history.     PAST SURGICAL  HISTORY:  History reviewed. No pertinent surgical history.     FAMILY HISTORY:  History reviewed. No pertinent family history.     Review of Systems:  Review of Systems:  Constitutional:  Negative for activity change.   HENT:  Negative for congestion and trouble swallowing.    Respiratory:  Negative for shortness of breath.  negative Obstructive sleep apnea.  Cardiovascular:  Negative for chest pain. Negative Blood thinners. positive hypertension.   Gastrointestinal:  Negative for abdominal pain.   Endocrine: negative Diabetes mellitus.   Skin:  Negative for rash.  Allergic/Immunologic: Negative for immunocompromised state.   Neurological:  Negative for dizziness.   Psychiatric/Behavioral:  Negative for behavioral problems.      GENERAL EXAM:  General: Well appearing female, in no acute distress  Respiratory: Normal respiratory effort  Cardiovascular: No visual or palpable edema  Skin: no identified rashes, no induration, erythema or cyanosis  Neurologic: Light touch sensation is intact, no allodynia or hyperalgesia  Gait: Normal gait and station  symptoms.     PHYSICAL EXAM:  Ortho Exam  General Examination:  GENERAL APPEARANCE: Alert, awake, orientated. No acute distress.   Gait:  Gait normal.     Cervical Spine:  INSPECTION: abnormal lordotic curvature.   PALPATION:  Negative midline tenderness, Positive right trapezial Tenderness  RANGE OF MOTION: The patient exhibits full forward flexion with Active Extension to 30 degrees and Lateral rotation to the Left is to 45 degrees and Lateral rotation to the Right is to 60 degrees.  MOTOR STRENGTH: 5/5 deltoids, biceps, triceps, wrist extensors, wrist flexors, grip strength  SENSATIONS: intact to light touch bilaterally  TESTS: Positive Spurlings Test    Right shoulder:  Inspection: No scapular splinting, no deformity, no scapular winging, no muscle asymmetry, no atrophy.  Palpation: No tenderness to palpation of the [facility] joint. No tenderness to palpation of the biceps  tendon within the bicipital groove.  AROM: Active Forward Flexion is to 160 degrees, active Abduction is to 90 degrees and Active IR behind the back is to T12 level  PROM: ER at Neutral is to 90 degrees and ER at 90 degrees of Abduction is to 90 degrees.  Strength: 4/5.  Tests: Positive empty can, positive impingement, negative speed, negative liftoff, negative Neer, negative Obrien's. No motor or sensory deficits.       Left shoulder:  Inspection: No scapular splinting, no deformity, no scapular winging, no muscle asymmetry, no atrophy.  Palpation: No tenderness to palpation of AC joint.  No tenderness to palpation of the biceps tendon within the bicipital groove.  AROM: Active Forward Flexion is to 180 degrees, Active Abduction is to 90 degrees and Active IR behind the back is to the T5 level  PROM: ER at Neutral is to 90 degrees and ER at 90 degrees of Abduction is to 90 degrees.  Strength: 5/5.  Tests: Negative empty can, negative impingement, negative speed, negative liftoff, negative Neer, negative Obrien's. No motor or sensory deficits.      RADIOLOGY:  XR CERVICAL SPINE (2-3 VIEWS) (CLINIC PERFORMED)  AP and lateral views of the cervical spine were taken today and reviewed   with the patient.  These reveal moderate to severe DDD at C5-6 and C6-7.  XR SHOULDER RIGHT (MIN 2 VIEWS) (CLINIC PERFORMED)  4 views of the right shoulder were taken today and reviewed with the   patient.  These do not reveal any chronic or acute bony abnormalities.         PROCEDURE:  Procedure: Shoulder subacromial Steroid Injection  Extremity: right SHOULDER    Procedure in Detail:   I first discussed with the patient the risks, benefits and potential complications  associated with a subacromial steroid injection and it was determined that the patient wanted to proceed with the injection.  I then marked the site of the injection.  I then prepped the injection sight under sterile conditions. Ethyl Chloride was sprayed on the injection  site to numb the skin. I then injected 3 ml of 0.5% Marcaine and 80 mg of Depo-Medrol into the subacromial space. The injection site was then cleansed and hemostasis was obtained.  Sterile dressing was applied. The patient tolerated this well and there were no complications.     ASSESSMENT:    ICD-10-CM    1. Right rotator cuff tendinitis  M75.81       2. Cervical radiculopathy  M54.12       3. 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 Right arm pain  M79.601 XR CERVICAL SPINE (2-3 VIEWS) (  CLINIC PERFORMED)     XR SHOULDER RIGHT (MIN 2 VIEWS) (CLINIC PERFORMED)               PLAN: Upon discussion with the patient, we have recommended a subacromial injection into the right shoulder today.  She agrees.  In addition, we have offered her formal therapy for her cervical spine and her right shoulder.  Unfortunately, the patient states she has a fixed income with a $20 co-pay for physical therapy.  She would prefer to do exercises on her own at home and I would like to see her back in 6 weeks for reevaluation.          An electronic signature was used to authenticate this note.    --Leim Fabry, PA

## 2022-02-25 ENCOUNTER — Encounter: Attending: Surgical | Primary: Family Medicine

## 2022-03-13 NOTE — Telephone Encounter (Signed)
The patient is requesting a phone call regarding pain in the right shoulder,down arm to fingers.

## 2022-03-14 NOTE — Telephone Encounter (Signed)
I called and left a voicemail for the patient. I said if she could give me a call back, I'd be happy to answer any questions she might have or get her scheduled for an appointment to have [facility] evaluate her.

## 2022-03-21 NOTE — Telephone Encounter (Signed)
Formatting of this note might be different from the original.  .CLINICAL CONCERN  Concern / Question:  Alana from [facility] called in on behalf of patient. She is needing the doctor's order and last office notes to complete prior authorization with insurance. The fax number provided is [phone]. Please call and advise Alana from [facility] and choose option 6.   Electronically signed by Vertis Kelch at 03/21/2022 11:54 AM EST

## 2022-03-21 NOTE — Telephone Encounter (Signed)
Formatting of this note might be different from the original.  Faxed LOV noted for 03/19/22 and Orders for CT- Lumbar & Thoracic to [facility].   Electronically signed by Robie Ridge M at 03/21/2022 12:10 PM EST

## 2022-03-22 ENCOUNTER — Encounter: Admit: 2022-03-22 | Discharge: 2022-03-22 | Payer: MEDICARE | Attending: Surgical | Primary: Family Medicine

## 2022-03-22 DIAGNOSIS — M7581 Other shoulder lesions, right shoulder: Secondary | ICD-10-CM

## 2022-03-22 NOTE — Progress Notes (Signed)
REASON FOR APPOINTMENT:  Wanda Larson (DOB:  01-20-1961) is a 61 y.o. female, who presents with a chief complaint of right shoulder pain.      Subjective   SUBJECTIVE:  The patient returns to the clinic approximately 2 months, since last visit for re-evaluation of her right shoulder.  On last visit, the patient was diagnosed with right rotator cuff tendinitis.  She did undergo a subacromial cortisone injection with very good pain relief.  However, over the weekend, she helped her daughter decorate for Christmas and the next day her right shoulder pain was worse.  She states that at the beginning of 2024 she is going to undergo a significant spinal fusion at [facility].  Oral anti-inflammatories and Tylenol are not helping her right shoulder pain and she is here requesting repeat cortisone injection.    VITALS:  Ht 1.626 m (5\' 4" )   Wt 59.9 kg (132 lb)   BMI 22.66 kg/m  Body mass index is 22.66 kg/m.       Review of Systems:  Review of Systems:  Constitutional:  Negative for activity change.   HENT:  Negative for congestion and trouble swallowing.    Respiratory:  Negative for shortness of breath.  negative Obstructive sleep apnea.  Cardiovascular:  Negative for chest pain. negative Blood thinners. positive hypertension.   Gastrointestinal:  Negative for abdominal pain.   Endocrine: negative Diabetes mellitus.   Skin:  Negative for rash.  Allergic/Immunologic: Negative for immunocompromised state.   Neurological:  Negative for dizziness.   Psychiatric/Behavioral:  Negative for behavioral problems.      GENERAL EXAM:  General: Well appearing female, in no acute distress  Respiratory: Normal respiratory effort  Cardiovascular: No visual or palpable edema  Skin: no identified rashes, no induration, erythema or cyanosis  Neurologic: Light touch sensation is intact, no allodynia or hyperalgesia  Gait: Normal gait and station  symptoms.     PHYSICAL EXAM:  Ortho Exam   Right shoulder:  Inspection: No scapular  splinting, no deformity, no scapular winging, no muscle asymmetry, no atrophy.  Palpation: No tenderness to palpation of the [facility] joint. No tenderness to palpation of the biceps tendon within the bicipital groove.  AROM: Active Forward Flexion is to 160 degrees, active Abduction is to 90 degrees and Active IR behind the back is to T12 level  PROM: ER at Neutral is to 90 degrees and ER at 90 degrees of Abduction is to 90 degrees.  Strength: 4/5.  Tests: Positive empty can, positive impingement, negative speed, negative liftoff, negative Neer, negative Obrien's. No motor or sensory deficits.       Left shoulder:  Inspection: No scapular splinting, no deformity, no scapular winging, no muscle asymmetry, no atrophy.  Palpation: No tenderness to palpation of AC joint.  No tenderness to palpation of the biceps tendon within the bicipital groove.  AROM: Active Forward Flexion is to 180 degrees, Active Abduction is to 90 degrees and Active IR behind the back is to the T5 level  PROM: ER at Neutral is to 90 degrees and ER at 90 degrees of Abduction is to 90 degrees.  Strength: 5/5.  Tests: Negative empty can, negative impingement, negative speed, negative liftoff, negative Neer, negative Obrien's. No motor or sensory deficits.           PROCEDURE:  Procedure: Shoulder subacromial Steroid Injection  Extremity: right SHOULDER    Procedure in Detail:   I first discussed with the patient the risks, benefits and potential  complications  associated with a subacromial steroid injection and it was determined that the patient wanted to proceed with the injection.  I then marked the site of the injection.  I then prepped the injection sight under sterile conditions. Ethyl Chloride was sprayed on the injection site to numb the skin. I then injected 3 ml of 0.5% Marcaine and 80 mg of Depo-Medrol into the subacromial space. The injection site was then cleansed and hemostasis was obtained.  Sterile dressing was applied. The patient  tolerated this well and there were no complications.     ASSESSMENT:    ICD-10-CM    1. Right rotator cuff tendinitis  M75.81                PLAN: Upon discussion with the patient, we have recommended repeat cortisone injection into the right shoulder today.  She agrees.  We wish her well in her upcoming surgery and were happy to see her back on an as-needed basis.    Follow Up: As needed      An electronic signature was used to authenticate this note.    --Leim Fabry, PA

## 2022-04-26 NOTE — Telephone Encounter (Signed)
Patient made an online request to see PA Donnal Debar. Please contact and assist with scheduling

## 2022-04-26 NOTE — Telephone Encounter (Signed)
Appt scheduled

## 2022-05-01 ENCOUNTER — Encounter: Payer: MEDICARE | Attending: Surgical | Primary: Family Medicine

## 2022-05-07 NOTE — Progress Notes (Signed)
Formatting of this note is different from the original.  Patient ID: Wanda Larson is a 62 y.o. female.    Chief Complaint  Severe mechanical TL pain with good rest relief    History of Present Illness  Jan 3960  62 year old female follows up via telemedicine joined by her daughter today.  Her clinical complaints remain the same.  She notes over 2 years she has had progressive deformity where she is shifted off to the left.  She also has positive sagittal balance.  She gets excellent rest relief when she is lying down.  It is hard for her to stand up at all for any duration.  She also has pain radiating down her legs when she is standing that goes away when she is lying down.  She is miserable and would like to consider surgical intervention.    Dec 2044  This 62 year old female presents to the office today with complaints of neck, right shoulder and right arm pain as well as mid back pain and progressive scoliotic deformity.  She states this was relatively new for her began last October.  She was diagnosed with osteoporosis with a worst T score in the femur and hip of -2.5 and -2.2 respectively.  Her T score in the spine was -0.3.  She was given 1 dose of intravenous Prolia but there is concern about osteonecrosis of the jaw by her dentist and she is reluctant to get a second dose of this medication.  She notes no history of known compression fracture.  She denies any significant lower extremity radicular pain.  She denies frank bowel bladder dysfunction or saddle anesthesia.  She notes no left arm symptoms.  She has severe pain in the right arm and decreased range of motion of the right shoulder.  She states going back several years she has had epidural steroid injections in both her neck and back in the past but no prior surgery of either region.  She also states she recently had a cortisone injection in her right shoulder with improvement.  She is done some physical therapy in the past for the right shoulder.  She  has not had advanced imaging studies of the right shoulder recently.  She has not had EMG studies of the right arm.  She presents here today with recent MRIs of her cervical, thoracic and lumbar spine for review and further treatment recommendation.    The patient's primary reason for visit is to discuss their scoliosis or spinal deformity and they state that they have neck pain, right arm/hand pain, and lower back pain.    Neck:  The patient describes their pain as having sharp pain and aching pain and it is steadily worsening with 9/10 pain at its worst, 10/10 pain on average, 100% pain during waking hours, and 20% pain relief in most comfortable position. Pain is worse while lying down.    Upper Back:  The patient describes their pain as having weakness and it is steadily worsening with 10/10 pain at its worst, 10/10 pain on average, 90% pain during waking hours, and 80% pain relief in most comfortable position.  Pain is worse while standing and walking.    Right Arm/Hand:  The patient describes their pain as having sharp pain, tingling, aching pain, numbness and weakness and it is steadily worsening with 10/10 pain at its worst, 10/10 pain on average, 100% pain during waking hours, and 20% pain relief in most comfortable position.    Lower Back:  The patient describes their pain as having aching pain and weakness and it is steadily worsening with 9/10 pain at its worst, 7/10 pain on average, 80% pain during waking hours, and 90% pain relief in most comfortable position. Pain is worse while sitting. Pain is better while walking.    Right Hip/Groin:  The patient describes their pain as having burning pain and it is steadily worsening with 10/10 pain at its worst, 10/10 pain on average, 90% pain during waking hours, and 90% pain relief in most comfortable position. Pain is worse while sitting and walking.    The patient has experienced the following: walking seems more unbalanced and pain awakens from sleep. Their  problem limits their ability to work or maintain their homeThey did not have an accident since their last visit that aggravated their symptoms    Relevant PMH/PSH for spine: No prior spine surgery    History reviewed. No pertinent past medical history.    History reviewed. No pertinent surgical history.    Social History     Socioeconomic History    Marital status: Divorced   Tobacco Use    Smoking status: Every Day     Packs/day: 0.50     Years: 30.00     Additional pack years: 0.00     Total pack years: 15.00     Types: Cigarettes    Smokeless tobacco: Never     No family history on file.    Allergies   Allergen Reactions    Erythromycin Hives     Other reaction(s): Not available  Event:    Other reaction(s): Hives  Other reaction(s): Hives  States she is able to take Azithromycin  States she is able to take Azithromycin  States she is able to take Azithromycin  Other reaction(s): Hives  Other reaction(s): Hives  States she is able to take Azithromycin  States she is able to take Azithromycin     Prednisone Shortness of breath     Dyspnea with High Dose Prednisone  Other reaction(s): Shortness Of Breath  States she is able to take DepoMedrol injection  Dyspnea with High Dose Prednisone  States she is able to take DepoMedrol injection    Citalopram      Other reaction(s): Other (See Comments)  No relief  No relief    Atorvastatin      Other reaction(s): Other (See Comments)  Muscle aches  Muscle aches  myalgia    Rosuvastatin      Other reaction(s): Other (See Comments)  Muscle aches  Muscle aches  myalgia    Sertraline      Other reaction(s): Other (See Comments)  jittery  jittery     Current Outpatient Medications   Medication Instructions    amLODIPine (Norvasc) 5 mg tablet No dose, route, or frequency recorded.    atenolol (Tenormin) 50 mg tablet No dose, route, or frequency recorded.    cholecalciferol (VITAMIN D-3) 1,000 Units, oral, Daily    famotidine (Pepcid) 20 mg tablet No dose, route, or frequency  recorded.    gabapentin (Neurontin) 400 mg capsule TAKE 2 CAPSULES THREE TIMES DAILY    omeprazole (PriLOSEC) 40 mg DR capsule No dose, route, or frequency recorded.    oxyCODONE-acetaminophen (Percocet) 10-325 mg tablet Take 1 tablet 3 times a day by oral route as needed.     Vitals & Measurements  There were no vitals filed for this visit.    Ortho Exam  General: AAO x 4, NAD  Cardio: RRR by palpation  Resp: breathing unlabored    Gait: Slightly unsteady but not ataxic, marked anterior sagittal balance.  Tenderness: Diffuse mid back tenderness  ROM: Severe range of motion reductions of her thoracic and lumbar spine.  Marked right lateral deviation and rib prominence    Upper Extremity   Right Left   Deltoid 2/5 5/5   Biceps 4/5 5/5   Triceps 4/5 5/5   Wrist extension 4/5 5/5   Grip 4/5 5/5   Intrinsics 4/5 5/5   Slightly limited by pain    Lower Extremity   Right Left   IP 5/5 5/5   Quadriceps 5/5 5/5   Tibialis anterior 5/5 5/5   EHL 5/5 5/5   Gastrocnemius 5/5 5/5     Sensation: Normal to light touch throughout upper and lower extremities bilaterally.     Reflexes:   Right Left   Bi 2+ 2+   Tri 2+ 2+   BR 2+ 2+   Q 2+ 2+   A 1+ 1+     Hoffman: negative bilaterally  IBR: negative  Clonus: negative bilaterally  Babinski: WNL    Spurling's test: negative  Lhermitte's: negative  Straight leg raise: negative    Shoulder:  No tenderness to palpation  Active and passive ROM full, painless  Negative impingement test, no pain with cross-body adduction    Hip:  No trochanteric tenderness to palpation  Negative log roll  Negative FABER/FADIR    Diagnostic Results - Imaging   MRI lumbar spine 02/12/2022 significant degenerative scoliosis noted with lateral listhesis at L2-3 L3-4.  L1-2 with mild left lateral recess stenosis at L2-3 with moderate central and lateral recess stenosis, L3-4 with moderate to severe right lateral recess stenosis moderate central stenosis  L4-5 with moderate right lateral recess stenosis L5-S1  without high-grade central stenosis right-sided severe foraminal stenosis at L4-5 and left at L2-3.    MRI thoracic spine 02/12/2022  Difficult to assess exact levels due to cuts where it is difficult to visualize though ribs for counting purposes in the lower thoracic spine there is a central left paracentral disc herniation that indents the spinal cord but there is no high-grade spinal cord compression and no thoracic myelomalacia.    MRI cervical spine 02/12/2022 slight anterolisthesis at C4-5.  Multilevel disc degeneration C5-6 C6-7.  Mild left-sided foraminal stenosis at C5-6 possible right-sided foraminal stenosis at C7-T1 only seen on the sagittal    3 foot scoliosis x-rays from December 2023 reviewed.  Marked scoliosis throughout the thoracolumbar spine apex right in the lumbar spine with multilevel lateral listhesis with significant left-sided coronal shift and positive sagittal balance.    CT thoracolumbar spine December 2023 was reviewed.  At least 50% correction in her deformity is noted with realignment in both the coronal and sagittal plane with very slight residual left coronal shift.    Diagnosis  Encounter Diagnoses   Name Primary?    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  I showed her with the images today of her scoliosis x-rays and her CT scan.  Just as we found in clinic when she was here she was passively correctable on the examination table and she has good correction on her supine CT scan.  I talked her today about a long segment deformity surgery.  I think  she would likely need a T4 to pelvis with or without an ALIF.  We talked about doing this is a single-stage.  She is going to work on clearance with her PCP and will discuss surgical timing when she is ready to move forward.    Approximately 22 minutes were spent in direct  consultation with the patient, reviewing the imaging and discussing a treatment plan.    This is a telehealth visit that was performed with the originating/patient site at patient's home in the state of [place] and the distant/physician site at [facility] in the state of [place]    Verbal Consent from the patient was obtained today to participate in this live audio/video visit.    This particular telehealth visit occurred during the COVID-19 pandemic.    I discussed with the patient the nature of our telehealth visits, and that:    1: I would evaluate the patient and recommend diagnostics and treatments based on my assessment    2: Our sessions are not being recorded and that personal health information is protected.    3: Our team would provide follow up care in person if/when the patient needs it.    The patient?s identity was verified and confirmed with 2 separate identifiers today prior to starting the visit.    Electronically signed by Doree Barthel, MD at 05/07/2022  5:47 PM EST

## 2022-05-13 ENCOUNTER — Ambulatory Visit
Admit: 2022-05-13 | Discharge: 2022-05-13 | Payer: MEDICARE | Attending: Cardiovascular Disease | Primary: Family Medicine

## 2022-05-13 ENCOUNTER — Ambulatory Visit: Admit: 2022-05-13 | Discharge: 2022-05-13 | Payer: MEDICARE | Attending: Surgical | Primary: Family Medicine

## 2022-05-13 DIAGNOSIS — I10 Essential (primary) hypertension: Secondary | ICD-10-CM

## 2022-05-13 DIAGNOSIS — M7581 Other shoulder lesions, right shoulder: Secondary | ICD-10-CM

## 2022-05-13 NOTE — Progress Notes (Unsigned)
REASON FOR APPOINTMENT:  Wanda Larson (DOB:  April 20, 1960) is a 62 y.o. female, who presents with a chief complaint of ***.      Subjective   SUBJECTIVE:  The patient returns to the clinic *** , since last visit for re-evaluation of ***    VITALS:  There were no vitals taken for this visit. There is no height or weight on file to calculate BMI.       Review of Systems:  Review of Systems:  Constitutional:  Negative for activity change.   HENT:  Negative for congestion and trouble swallowing.    Respiratory:  Negative for shortness of breath.  {Positive Negative:48520::"negative"} Obstructive sleep apnea.  Cardiovascular:  Negative for chest pain. {Positive Negative:48520::"negative"} Blood thinners. {Positive Negative:48520::"negative"} hypertension.   Gastrointestinal:  Negative for abdominal pain.   Endocrine: {Positive Negative:48520::"negative"} Diabetes mellitus.   Skin:  Negative for rash.  Allergic/Immunologic: Negative for immunocompromised state.   Neurological:  Negative for dizziness.   Psychiatric/Behavioral:  Negative for behavioral problems.      GENERAL EXAM:  General: Well appearing female, in no acute distress  Respiratory: Normal respiratory effort  Cardiovascular: No visual or palpable edema  Skin: no identified rashes, no induration, erythema or cyanosis  Neurologic: Light touch sensation is intact, no allodynia or hyperalgesia      PHYSICAL EXAM:  Ortho Exam       RADIOLOGY:  XR CERVICAL SPINE (2-3 VIEWS) (CLINIC PERFORMED)  AP and lateral views of the cervical spine were taken today and reviewed   with the patient.  These reveal moderate to severe DDD at C5-6 and C6-7.  XR SHOULDER RIGHT (MIN 2 VIEWS) (CLINIC PERFORMED)  4 views of the right shoulder were taken today and reviewed with the   patient.  These do not reveal any chronic or acute bony abnormalities.       PROCEDURE:    ASSESSMENT:  No diagnosis found.         PLAN: Upon discussion with the patient, we have recommended ***    Follow  Up:      An electronic signature was used to authenticate this note.    --Delight Bickle Delfin Edis, ATC

## 2022-05-13 NOTE — Progress Notes (Signed)
Date:  May 13, 2022  Patient name: Wanda Larson  Date of Birth: 02-Jul-1960    CARDIOLOGY OFFICE EVALUATION      HISTORY OF PRESENT ILLNESS          Chief Complaint:    Chief Complaint   Patient presents with    New Patient       Wanda Larson is a 62 y.o. female here for the first time with me.  She is referred for surgical clearance evaluation.    To undergo scoliosis related procedure at [facility] with Dr Doree Barthel.  Has had a lot of back pain that limits here in last few years.  Denies any Cp, SOB, palps, syncope, edema.     I reviewed recent ECG.  Showed sinus rhythm, nonspecific ST-T abnormality.  ECG today sinus, nonspecific IVCD, nonspecific ST-T abnormality.     No recent cardiac testing.     No recent labs to review    PAST MEDICAL, SOCIAL AND FAMILY HISTORY          Past Medical History:    Hypertension  GERD  Arthritis/disc disease/chronic pain  Scoliosis  Osteoporosis  ADHD    Hysterectomy  Ankle ORIF     Social History:   Divorced  Smoker-- says she has "pretty much quit" outside stress cigs.  PPD for many years  Denies alcohol/drugs     Family History:   Denies first degree relatives with heart issues    MEDICATIONS AND ALLERGIES         Prior to Admission medications    Medication Sig Start Date End Date Taking? Authorizing Provider   amphetamine-dextroamphetamine (ADDERALL) 30 MG tablet Take 1 tablet by mouth 3 times daily.   Yes [provider]   aspirin 81 MG EC tablet Take 1 tablet by mouth daily   Yes [provider]   famotidine (PEPCID) 20 MG tablet  04/12/21  Yes [provider]   fluconazole (DIFLUCAN) 150 MG tablet TAKE 1 TABLET BY MOUTH DAILY 1 WEEK APART 03/16/22  Yes [provider]   amLODIPine (NORVASC) 5 MG tablet 1 tablet Orally Once a day   Yes [facility] Reconciliation, [facility], MD    atenolol (TENORMIN) 50 MG tablet 1 tablet Orally Once a day   Yes [facility] Automatic Reconciliation, Rsfh, MD   gabapentin (NEURONTIN) 400 MG capsule 2 capsules in am and 3 tablets pm orally BID   Yes [facility] Automatic Reconciliation, Rsfh, MD   omeprazole (PRILOSEC) 40 MG delayed release capsule 1 capsule   Yes [facility] Automatic Reconciliation, Rsfh, MD   oxyCODONE-acetaminophen (PERCOCET) 10-325 MG per tablet 1 tablet as needed Orally every 6 hrs   Yes [facility] Automatic Reconciliation, Rsfh, MD   Flaxseed, Linseed, (FLAX SEED OIL) 1000 MG CAPS Take 1 capsule by mouth daily  Patient not taking: Reported on 05/13/2022    [provider]   olopatadine (PATANOL) 0.1 % ophthalmic solution  02/06/17   [provider]       Allergies:  Erythromycin, Erythromycin base, Prednisone, Other, Sertraline hcl, Atorvastatin, Citalopram, Rosuvastatin, and Sertraline    REVIEW OF SYSTEMS         Review of Systems   Constitutional:         Negative other than symptoms listed above   HENT:          Negative other than symptoms listed above   Respiratory:          Negative other than  symptoms listed above   Gastrointestinal:         Negative other than symptoms listed above   Endocrine:        Negative other than symptoms listed above   Genitourinary:         Negative other than symptoms listed above   Musculoskeletal:         Negative other than symptoms listed above   Allergic/Immunologic:        Negative other than symptoms listed above   Neurological:         Negative other than symptoms listed above   Hematological:         Negative other than symptoms listed above   Psychiatric/Behavioral:          Negative other than symptoms listed above          PHYSICAL EXAM          BP 130/80   Pulse 64   Ht 1.626 m (5\' 4" )   Wt 59.9 kg (132 lb)   SpO2 99%   BMI 22.66 kg/m      Physical Exam  Constitutional:       General: She is not in acute distress.  HENT:      Head: Normocephalic.   Eyes:      Extraocular Movements:  Extraocular movements intact.   Neck:      Vascular: No carotid bruit or JVD.   Cardiovascular:      Rate and Rhythm: Normal rate and regular rhythm.      Pulses:           Radial pulses are 2+ on the right side and 2+ on the left side.      Heart sounds: No murmur heard.  Pulmonary:      Breath sounds: Normal breath sounds.   Abdominal:      General: Bowel sounds are normal. There is no abdominal bruit.      Palpations: Abdomen is soft.      Tenderness: There is no abdominal tenderness.      Comments: Abdominal aorta not enlarged by palpation   Musculoskeletal:      Right lower leg: No edema.      Left lower leg: No edema.   Neurological:      General: No focal deficit present.   Psychiatric:         Attention and Perception: Attention normal.         Mood and Affect: Mood normal.         ASSESSMENT AND RECOMMENDATIONS        Wanda Larson is here for evaluation prior to scoliosis related surgery.  She is a smoker, ECG is mildly abnormal.    She does not have known CAD, renal insufficiency, prior stroke, heart failure, or diabetes.  So on the surface, her perioperative risk is low.    Obviously the question still exists if this is an  individual who has significant occult coronary/vascular disease.  That is a question that must be asked, particularly in smokers who are not very active.  This question was addressed essentailly in the CARP trial years ago, and preoperative cardiac testing/treatment did not alter surgical outcomes there.  This trial did exclude people with severe valve disease, active heart failure, multivessel/left main disease.    I think the chance she has high risk/multivessel CAD is low, and she is obviously very limited functionally by surgery which is likely to only get worse.  I do not feel a strategy of cardiac investigation and subsequent treatment prior to surgery serves her best.    For all the reasons outlined below, I feel she is acceptable risk to proceed without preoperative cardiac  testing.      Jerrell Belfast, MD  [facility]

## 2022-07-11 ENCOUNTER — Encounter: Attending: Specialist | Primary: Family Medicine

## 2022-10-28 ENCOUNTER — Encounter: Admit: 2022-10-28 | Discharge: 2022-10-28 | Payer: MEDICARE | Attending: Surgical | Primary: Family Medicine

## 2022-10-28 DIAGNOSIS — M7581 Other shoulder lesions, right shoulder: Secondary | ICD-10-CM

## 2022-10-28 MED ORDER — METHYLPREDNISOLONE ACETATE 40 MG/ML IJ SUSP
40 | Freq: Once | INTRAMUSCULAR | Status: AC
Start: 2022-10-28 — End: 2022-10-28
  Administered 2022-10-28: 12:00:00 40 mg via INTRA_ARTICULAR

## 2022-10-28 MED ORDER — BUPIVACAINE HCL 0.5 % IJ SOLN
0.5 | Freq: Once | INTRAMUSCULAR | Status: AC
Start: 2022-10-28 — End: 2022-10-28
  Administered 2022-10-28: 12:00:00 15 mL via INTRA_ARTICULAR

## 2022-10-28 NOTE — Progress Notes (Signed)
ESTABLISHED PATIENT  CHIEF COMPLAINT:  Chief Complaint   Patient presents with    Injections     R SHOULDER CORTISONE        HPI:    Wanda Larson is a 62 y.o. year old female who returns to the clinic 5-1/2 months since last visit, for follow up evaluation of her right shoulder.  The patient has a very well documented right rotator cuff tendinopathy and does note improvement in their pain from periodic injections.  The patient denies new injury or trauma, but is requesting an injection into the right shoulder today.    VITALS Height 1.626 m (5\' 4" ), weight 59.9 kg (132 lb). Body mass index is 22.66 kg/m.       Review of Systems:  Review of Systems:  Constitutional:  Negative for activity change.   HENT:  Negative for congestion and trouble swallowing.    Respiratory:  Negative for shortness of breath.  positive Obstructive sleep apnea.  Cardiovascular:  Negative for chest pain. negative Blood thinners. positive hypertension.   Gastrointestinal:  Negative for abdominal pain.   Endocrine: negative Diabetes mellitus.   Skin:  Negative for rash.  Allergic/Immunologic: Negative for immunocompromised state.   Neurological:  Negative for dizziness.   Psychiatric/Behavioral:  Negative for behavioral problems.       Physical Examination:  General: Well appearing female, in no acute distress  Respiratory: Normal respiratory effort  Cardiovascular: No visual or palpable edema  Skin: no identified rashes, no induration, erythema or cyanosis  Neurologic: Light touch sensation is intact, no allodynia or hyperalgesia  Gait: Normal gait and station  Extremities: No evidence of clubbing, cyanosis, tenosynovitis or nail pitting    PROCEDURE:  Procedure: Shoulder subacromial Steroid Injection  Extremity: right SHOULDER    Procedure in Detail:   I first discussed with the patient the risks, benefits and potential complications  associated with a subacromial steroid injection and it was determined that the patient wanted to proceed  with the injection.  I then marked the site of the injection.  I then prepped the injection sight under sterile conditions. Ethyl Chloride was sprayed on the injection site to numb the skin. I then injected 3 ml of 0.5% Marcaine and 80 mg of Depo-Medrol into the subacromial space. The injection site was then cleansed and hemostasis was obtained.  Sterile dressing was applied. The patient tolerated this well and there were no complications.     Assessment:     ICD-10-CM    1. Right rotator cuff tendinitis  M75.81                Treatment Plan:  Upon discussion with the patient, we have recommended subacromial injection into the right shoulder today.  She agrees.  We are happy to see her back on an as-needed basis.

## 2022-12-19 NOTE — Progress Notes (Signed)
 Surgery:  PSF LEVELS T4-ILLIUM; 07/15/2022    HPI:  Sept 2024  62 yo F follows up today for her thoracolumbar spine.  She is doing great.  She has not pain.  Her posture is good.  She has some problem with a frozen shoulder.  She is going to need to get it treated she things.  She has returned to work.  After a full day she has tingling her ankles and feet.  It is not present in her legs.  Compression stockings have been helpful.    May 2024  This 62 y.o. female presents today for the first time 6-weeks status post the abovementioned surgery. Her pain is currently a 5 out of 10. Overall, recovery is going well. She has no wound complaints. She has had significantly less pain than prior to surgery and reports she hasn't taken narcotics since the week after surgery. Previously she had been taking these for about 20 years. Now, she only is taking gabapentin and tylenol  for some soreness and residual pain. She is pleased with the recovery thus far and feels surgery was beneficial.  She is tearful with tears of joy in clinic today.      ROS:  11 point review systems is performed, reviewed and documented in the intake form.    Physical Exam:  Vitals: reviewed and recorded in EMR  Gen:  The patient is awake, alert and appropriate.    Pysch:  The mood is euthymic.   HEENT:  The head is normocephalic.  The sclera are white.    Lymph:  There is normal skin turgor and no lymphadenopathy.    CV:  Heartrate is regular.    Pulm:  Breathing is unlabored  GI/GU:  The abdomen not distended.    Spine Exam:  Inspection:  Incisions -- healed  Motor:   Right Left   Iliopsoas 5 5   Quad 5 5   Hamstring 5 5   Tib Ant 5 5   EHL 5 5   Gastroc 5 5         Reflexes:    RLE             Patella  2+     Achilles  2+          LLE             Patella  2+        Achilles  2+                               Hip Exam:                             Right                                 Left  ROM                                  Full, painless                          Full, painless  TTP Grt Troch  Absent                                   Absent      Imaging:  XR Spine Lumbar 2-3 Views  Imaging:  AP, lateral, lumbar radiographs performed 08/27/22 at [facility] are   reviewed.  These show good quality images.  Soft tissue silhouettes are   within normal limits.  Normal-appearing bowel gas pattern.  Bone density   appears within normal limits.  Bilateral iliac bolt fixation with L5-S1   ALIF and bilateral instrumentation through the visualized lower thoracic   spine.  No evidence of hardware failure or change in alignment.  Stable   postoperative x-ray.  Visualized hip joint space on the right appears   well-preserved.  XR Spine Survey/Scoliosis 2-3 Views  Imaging:  AP, lateral, scoliosis radiographs performed 08/27/22 at [facility] are   reviewed.  These show good quality images.  Soft tissue silhouettes are   within normal limits.  Normal-appearing bowel gas pattern.  Bone density   appears within normal limits.   Bilateral pedicle screw instrumentation T5   to the ilium with anterior interbody L5-S1.  Well-balanced in the sagittal   and coronal plane.  No evidence of hardware failure.  No evidence of   proximal junctional kyphosis.  Overall stable postoperative x-ray         Diagnosis  Encounter Diagnoses   Name Primary?   . 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 Lumbar stenosis with neurogenic claudication    . Status post lumbar surgery          Assessment/Plan  Clinically she is doing great.  She is going to try to get images of the XR to us .  We do have the report, which I reviewed with her today.  We talked about her progression of activities and restrictions.    She will let us  know when she is able to submit the images for review.    Approximately 22 minutes were spent in direct consultation with the patient, reviewing the imaging and discussing a treatment plan.    This is a telehealth visit that was performed  with the originating/patient site at patient's home in the state of [place]  and the distant/physician site at [facility] in the state of [place]     Verbal Consent from the patient was obtained today to participate in this live audio/video visit.    I discussed with the patient the nature of our telehealth visits, and that:    1: I would evaluate the patient and recommend diagnostics and treatments based on my assessment    2: Our sessions are not being recorded and that personal health information is protected.    3: Our team would provide follow up care in person if/when the patient needs it.    The patient's identity was verified and confirmed with 2 separate identifiers today prior to starting the visit.    Answers submitted by the patient for this visit:  Spine Patient Questionnaire (Submitted on 12/19/2022)  Chief Complaint: SPINE MYCHART PATIENT RFV  Returning for same problem: yes  Reason for visit: to discuss pain or some other problem in neck, back, arms or legs  Primary complaint: right shoulder  Secondary complaint: right leg/foot  Third complaint: left leg/foot  Red flags: pain makes it hard to fall asleep  Problem impact: has no  major limitations on their daily activities  Had accident or injury: did not have an accident  Right shoulder pain description: none of these  Right shoulder pain trending: completely better  Right shoulder pain worst: 9  Right shoulder pain average: 9  Right shoulder pain frequency: 80%  Right shoulder pain relief: 80%  Right shoulder pain worse: standing, none of the above  Right shoulder pain better: walking  Right leg/foot pain description: tingling, none of these  Right leg/foot pain trending: completely better  Right leg/foot pain worst: 7  Right leg/foot pain average: 8  Right leg/foot pain frequency: 70%  Right leg/foot resting pain relief: 80%  Right leg/foot pain worse: none of the above  Right leg/foot pain better: none of the above  Left leg/foot pain description:  none of these  Left leg/foot pain trending: staying about the same  Left leg/foot pain worst: 8  Left leg/foot pain average: 8  Left leg/foot pain frequency: 90%  Left leg/foot resting pain relief: 90%  Left leg/foot pain worse: none of the above  Left leg/foot pain better: none of the above

## 2022-12-25 ENCOUNTER — Ambulatory Visit: Admit: 2022-12-25 | Discharge: 2022-12-25 | Payer: MEDICARE | Attending: Surgical | Primary: Family Medicine

## 2022-12-25 VITALS — Ht 64.0 in | Wt 132.0 lb

## 2022-12-25 DIAGNOSIS — M5412 Radiculopathy, cervical region: Secondary | ICD-10-CM

## 2022-12-25 MED ORDER — METHYLPREDNISOLONE ACETATE 40 MG/ML IJ SUSP
40 | Freq: Once | INTRAMUSCULAR | Status: AC
Start: 2022-12-25 — End: 2022-12-25

## 2022-12-25 MED ORDER — BUPIVACAINE HCL 0.5 % IJ SOLN
0.5 | Freq: Once | INTRAMUSCULAR | Status: AC
Start: 2022-12-25 — End: 2022-12-25

## 2022-12-25 MED ADMIN — methylPREDNISolone acetate (DEPO-MEDROL) injection 40 mg: 40 mg | INTRA_ARTICULAR | @ 18:00:00 | NDC 00009028003

## 2022-12-25 MED ADMIN — BUPivacaine (MARCAINE) 0.5 % injection 15 mg: 15 mL | INTRA_ARTICULAR | @ 18:00:00 | NDC 00409116318

## 2022-12-25 NOTE — Progress Notes (Signed)
 REASON FOR APPOINTMENT:  Wanda Larson (DOB:  December 31, 1960) is a 62 y.o. female, who presents with a chief complaint of right shoulder pain.      Subjective   SUBJECTIVE:  The patient returns to the clinic approximately 2 months, since last visit for re-evaluation of her right shoulder, but a new complaint of pain radiating down her entire right upper extremity, occasionally into her right hand and also into her right shoulder blade.  She denies associated numbness and tingling, but feels as though her right shoulder pain travels up to her neck and she feels that the right arm is heavy and feels weak.  She denies an injury or trauma to her neck, however she did undergo a 13-hour spine fusion in April 2024 performed at [facility].  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    VITALS:  Ht 1.626 m (5' 4)   Wt 59.9 kg (132 lb)   BMI 22.66 kg/m  Body mass index is 22.66 kg/m.       Review of Systems:  Review of Systems:  Constitutional:  Negative for activity change.   HENT:  Negative for congestion and trouble swallowing.    Respiratory:  Negative for shortness of breath.  positive Obstructive sleep apnea.  Cardiovascular:  Negative for chest pain. negative Blood thinners. positive hypertension.   Gastrointestinal:  Negative for abdominal pain.   Endocrine: negative Diabetes mellitus.   Skin:  Negative for rash.  Allergic/Immunologic: Negative for immunocompromised state.   Neurological:  Negative for dizziness.   Psychiatric/Behavioral:  Negative for behavioral problems.      GENERAL EXAM:  General: Well appearing female, in no acute distress  Respiratory: Normal respiratory effort  Cardiovascular: No visual or palpable edema  Skin: no identified rashes, no induration, erythema or cyanosis  Neurologic: Light touch sensation is intact, no allodynia or hyperalgesia      PHYSICAL  EXAM:  Ortho Exam   Right shoulder:  Inspection: No scapular splinting, no deformity, no scapular winging, no muscle asymmetry, no atrophy.  Palpation: No tenderness to palpation of the [facility] joint. No tenderness to palpation of the biceps tendon within the bicipital groove.  AROM: Active Forward Flexion is to 160 degrees, active Abduction is to 90 degrees and Active IR behind the back is to T12 level  PROM: ER at Neutral is to 90 degrees and ER at 90 degrees of Abduction is to 90 degrees.  Strength: 4/5.  Tests: Positive empty can, positive impingement, negative speed, negative liftoff, negative Neer, negative Obrien's. No motor or sensory deficits.       Left shoulder:  Inspection: No scapular splinting, no deformity, no scapular winging, no muscle asymmetry, no atrophy.  Palpation: No tenderness to palpation of AC joint.  No tenderness to palpation of the biceps tendon within the bicipital groove.  AROM: Active Forward Flexion is to 180 degrees, Active Abduction is to 90 degrees and Active IR behind the back is to the T5 level  PROM: ER at Neutral is to 90 degrees and ER at 90 degrees of Abduction is to 90 degrees.  Strength: 5/5.  Tests: Negative empty can, negative impingement, negative speed, negative liftoff, negative Neer, negative Obrien's. No motor or sensory deficits.    Cervical Spine:  INSPECTION: abnormal lordotic curvature.   PALPATION:  Positive midline tenderness, Positive right sided trapezial  Tenderness  RANGE OF MOTION: The patient exhibits full forward flexion with Active Extension to 20 degrees and Lateral rotation to the Left is to 80 degrees and Lateral rotation to the Right is to 60 degrees.  MOTOR STRENGTH: 4/5 deltoids, biceps, triceps, wrist extensors, wrist flexors, grip strength  SENSATIONS: intact to light touch bilaterally  TESTS: Positive Spurlings Test    RADIOLOGY:  XR CERVICAL SPINE (2-3 VIEWS) (CLINIC PERFORMED)  AP and lateral views of the cervical spine were taken today and  reviewed   with the patient.  These reveal moderate to severe DDD at C5-6 and C6-7.  XR SHOULDER RIGHT (MIN 2 VIEWS) (CLINIC PERFORMED)  4 views of the right shoulder were taken today and reviewed with the   patient.  These do not reveal any chronic or acute bony abnormalities.       PROCEDURE:  Procedure: Shoulder subacromial Steroid Injection  Extremity: right SHOULDER    Procedure in Detail:   I first discussed with the patient the risks, benefits and potential complications  associated with a subacromial steroid injection and it was determined that the patient wanted to proceed with the injection.  I then marked the site of the injection.  I then prepped the injection sight under sterile conditions. Ethyl Chloride was sprayed on the injection site to numb the skin. I then injected 3 ml of 0.5% Marcaine  and 80 mg of Depo-Medrol  into the subacromial space. The injection site was then cleansed and hemostasis was obtained.  Sterile dressing was applied. The patient tolerated this well and there were no complications.       ASSESSMENT:    ICD-10-CM    1. Cervical radiculopathy  M54.12 MRI CERVICAL SPINE WO CONTRAST     Internal Referral to Spine Center      2. Degeneration of cervical intervertebral disc  M50.30       3. Right rotator cuff tendinitis  M75.81                PLAN: Upon discussion with the patient, we have recommended a Medrol  Dosepak for her neck and right shoulder pain.  The patient states she has adverse reaction to prednisone in which it causes hyperactivity and inability to sleep.  She would prefer not to have to take prednisone.  As a result, I have agreed to a repeat subacromial cortisone injection into the right shoulder today.  She also agrees.  In addition, considering her weakness and pain about the right upper extremity I am ordering an MRI of the cervical spine and we are referring her to the spine intake team to review the results.    Follow Up: As needed    A total of 30 minutes were  spent on this case on the day of the encounter, including preparation to see the patient, review of records, review of imaging studies, meeting with the patient, discussion of care, orders and documentation.   An electronic signature was used to authenticate this note.    --Burnard KATHEE Isaacs, PA

## 2022-12-25 NOTE — Telephone Encounter (Signed)
 Patient states that Wanda Larson hasn't received her prescription yet. Confirmed Wanda Larson [facility] [place]

## 2022-12-26 ENCOUNTER — Encounter

## 2022-12-26 MED ORDER — HYDROCODONE-ACETAMINOPHEN 5-325 MG PO TABS
5-325 | ORAL_TABLET | Freq: Four times a day (QID) | ORAL | 0 refills | Status: AC | PRN
Start: 2022-12-26 — End: 2022-12-31

## 2022-12-26 NOTE — Telephone Encounter (Signed)
The patient is requesting the pain medication prescription be sent to the listed pharmacy.

## 2022-12-26 NOTE — Telephone Encounter (Signed)
 Tresa Endo is working on refilling that prescription today. Will call patient back once Tresa Endo has submitted prescription.

## 2022-12-26 NOTE — Progress Notes (Signed)
 E-prescribed Norco 5 mg to [facility].

## 2022-12-27 NOTE — Telephone Encounter (Signed)
 Please sign the encounter if complete to empty in basket, thank you.

## 2023-02-19 NOTE — Telephone Encounter (Signed)
Pt [phone]    Pt is asking for a referral for pain management to include medication management.

## 2023-05-01 ENCOUNTER — Encounter

## 2023-05-01 NOTE — Telephone Encounter (Signed)
Patient would like to get a referral to [facility] for her back pain

## 2023-05-01 NOTE — Telephone Encounter (Signed)
Placed referral to Dr. Trixie Dredge to refer patient for pain management as Tresa Endo has not seen patient for back.

## 2023-08-11 ENCOUNTER — Encounter

## 2023-09-16 ENCOUNTER — Ambulatory Visit: Admit: 2023-09-16 | Discharge: 2023-09-16 | Payer: Medicare (Managed Care) | Primary: Family Medicine

## 2023-09-16 VITALS — Ht 63.0 in | Wt 137.0 lb

## 2023-09-16 DIAGNOSIS — M5412 Radiculopathy, cervical region: Secondary | ICD-10-CM

## 2023-09-16 NOTE — Addendum Note (Signed)
 Addended by: Shirleen Double on: 09/16/2023 02:21 PM     Modules accepted: Orders

## 2023-09-16 NOTE — Progress Notes (Signed)
 Subjective:   Chief Complaint:   Chief Complaint   Patient presents with    Neck Pain     Pt is here today for film review.  Pain is excruciating, causes headaches,  constant.  Pt admits that pain radiates from the right side of the jaw to the shoulder to the arm/hand all on the right.  Pain increases with trying to rest, extended sitting.  Pain decreases with walking, heat/ice temp.  Pt admits to a couple of days of relief with injections.  Pain is 8/10 today.        The patient was referred by Dr. Erving Heather for neurosurgical consultation.    HPI:  The patient is a pleasant 63 year old female presents the office today for evaluation of neck pain and right upper extremity radiculopathy.  Briefly, the patient was seen by our office previously due to back pain and thoracolumbar scoliosis.  Ultimately, she received care at [facility] with a T4 to ilium fusion.  She recovered well following that surgery.  Unfortunately, she has had recurrence of pain at this time in her neck.  The pain is present in the back of her neck along the cervical thoracic junction.  Will radiate down into her right shoulder and down into her right arm into the 4th and 5th digits on the right side.  She does endorse numbness as well.  Pain is 9/10 at worst.  She is unable to determine an aggravating factor.  She has had an epidural steroid injection in the cervical spine with slight relief.  She has not trialed physical therapy recently.  She has used narcotic pain medications which have slightly relieved her pain symptoms as well.  Last injection was approximately 3 months ago.  She states that she does feel weakness in the right hand when performing fine motor tasks.  Patient is left-handed.  Otherwise, denies additional neurologic symptoms.  Denies additional numbness, tingling, weakness.  She is not on any antiplatelet or anticoagulant medications.    Current Medications:    Current Outpatient Medications:     acetaminophen  (TYLENOL ) 500 MG tablet,  Take 2 tablets by mouth in the morning and 2 tablets at noon and 2 tablets in the evening., Disp: , Rfl:     ondansetron (ZOFRAN) 4 MG tablet, TAKE 1 TABLET BY MOUTH EVERY 8 HOURS AS NEEDED FOR NAUSEA AND VOMITING. TO BEGIN AFTER SURGERY, Disp: , Rfl:     oxyCODONE (ROXICODONE) 5 MG immediate release tablet, Take 1-2 tablets by mouth every 6 hours as needed., Disp: , Rfl:     amphetamine-dextroamphetamine (ADDERALL) 30 MG tablet, Take 1 tablet by mouth 3 times daily., Disp: , Rfl:     amLODIPine (NORVASC) 5 MG tablet, 1 tablet Orally Once a day, Disp: , Rfl:     atenolol (TENORMIN) 50 MG tablet, 1 tablet Orally Once a day, Disp: , Rfl:     gabapentin (NEURONTIN) 400 MG capsule, 2 capsules in am and 3 tablets pm orally BID, Disp: , Rfl:     tiZANidine (ZANAFLEX) 4 MG tablet, Take 1 tablet by mouth every 8 hours as needed (Patient not taking: Reported on 09/16/2023), Disp: , Rfl:     Medical History:  Past Medical History:   Diagnosis Date    Hyperlipidemia     Hypertension     Low back pain     Neck pain        Allergies/Intolerance:  Allergies   Allergen Reactions    Erythromycin Hives  Event:    Other reaction(s): Hives  Other reaction(s): Hives  States she is able to take Azithromycin  States she is able to take Azithromycin    Other reaction(s): Not available  Other reaction(s): Not available  Event:    Other reaction(s): Hives  Other reaction(s): Hives  States she is able to take Azithromycin  States she is able to take Azithromycin  States she is able to take Azithromycin  Other reaction(s): Hives  Other reaction(s): Hives  States she is able to take Azithromycin  States she is able to take Azithromycin  States she is able to take Azithromycin      Erythromycin Base Rash and Hives    Other/Food Hives     Mycin drugs    Sertraline Hcl      jittery    Atorvastatin Other (See Comments)     Muscle aches  Muscle aches  myalgia    Other reaction(s): Other (See Comments)  Muscle aches  Muscle  aches  myalgia  myalgia      Citalopram Other (See Comments)     No relief  No relief    Other reaction(s): Other (See Comments)  No relief  No relief  Not effective      Rosuvastatin Other (See Comments)     Muscle aches  Muscle aches  myalgia    Other reaction(s): Other (See Comments)  Muscle aches  Muscle aches  myalgia  myalgia      Sertraline Other (See Comments)     jittery  jittery  jittery    Other reaction(s): Other (See Comments)  jittery  jittery  jittery          Surgical History:  Past Surgical History:   Procedure Laterality Date    HYSTERECTOMY (CERVIX STATUS UNKNOWN)      LUMBAR FUSION          Family History:  No family history on file.    Social History:  Social History       Tobacco History       Smoking Status  Never      Smokeless Tobacco Use  Never              Alcohol History       Alcohol Use Status  Never              Drug Use       Drug Use Status  Never              Sexual Activity       Sexually Active  Not Asked                     Objective:   Body mass index is 24.27 kg/m.    Physical Exam  Alert and oriented. Sitting comfortably.  Bilateral upper extremity  -RUE Deltoid 5/5, Biceps 5/5, Triceps 5/5, Interosseous 5/5, Grip is 5/5  -LUE Deltoid 5/5, Biceps 5/5, Triceps 5/5, Interosseous 5/5, Grip is 5/5  Bilateral lower extremity   -RLE HF 5/5, KE 5/5, DF 5/5, PF 5/5, EHL 5/5  -LLE HF 5/5, KE 5/5, DF 5/5, PF 5/5, EHL 5/5  Sensation intact in bilateral upper and lower extremities  Negative clonus  Negative Babinski sign  Negative Hoffmann's  DTRs: 2+ bilaterally  Non tender to palpation     RADIOGRAPHS   I have independently interpreted and reviewed the pertinent imaging including MRI of the cervical spine which reveals multilevel degenerative disease  worst level of C7 and T1 where there is right-sided neuroforaminal stenosis.    No results found.           ASSESSMENT, PLAN, AND RECOMMENDATIONS:  In summary, patient is a 63 year old female presents the office today for evaluation of  neck pain and right upper extremity radiculopathy.  On imaging, the patient has multilevel degenerative disease of the cervical spine worse at the level of C7-T1 where there is moderate neuroforaminal stenosis with compression of the C8 nerve root.  Patient has a correlating C8 radiculopathy.  She has not tried much in regards to nonoperative management at this point.  No recent physical therapy or injection therapy.  I am optimistic that her symptoms will improve with additional conservative measures.    With consideration of the above, I recommend:  Referral to physical therapy for dedicated cervical spine PT  Referral to Dr. Mable Saver for cervical epidural injections  Recommend at least 6 weeks of nonoperative treatment, if the patient continues to have pain despite conservative measures, we will see her back for surgical discussion.  We briefly discussed a C7-T1 anterior cervical discectomy and fusion today in the office.      It was my pleasure seeing the patient in clinic today.  Please do not hesitate to reach out with any questions or concerns    Sharon December, PA-C       This plan of care was discussed directly with Dr. Andree Kayser, who has reviewed patient's case and independently interpreted patient's imaging.        A copy of today's office consultation progress note was sent to the referring physician.        Follow Up:  No follow-ups on file.
